# Patient Record
Sex: Male | Born: 1940 | Race: White | Hispanic: No | Smoking: Former smoker
Health system: Southern US, Community
[De-identification: ages and names within clinical notes are randomized; demographics above are authoritative.]

## PROBLEM LIST (undated history)

## (undated) DIAGNOSIS — Z87891 Personal history of nicotine dependence: Secondary | ICD-10-CM

---

## 2016-11-13 ENCOUNTER — Emergency Department (HOSPITAL_COMMUNITY): Payer: Medicare Other

## 2016-11-13 ENCOUNTER — Inpatient Hospital Stay (HOSPITAL_COMMUNITY)
Admission: EM | Admit: 2016-11-13 | Discharge: 2016-12-15 | DRG: 023 | Disposition: E | Payer: Medicare Other | Attending: Emergency Medicine | Admitting: Emergency Medicine

## 2016-11-13 DIAGNOSIS — I619 Nontraumatic intracerebral hemorrhage, unspecified: Secondary | ICD-10-CM

## 2016-11-13 DIAGNOSIS — G936 Cerebral edema: Secondary | ICD-10-CM | POA: Diagnosis present

## 2016-11-13 DIAGNOSIS — Z7982 Long term (current) use of aspirin: Secondary | ICD-10-CM | POA: Diagnosis not present

## 2016-11-13 DIAGNOSIS — J9600 Acute respiratory failure, unspecified whether with hypoxia or hypercapnia: Secondary | ICD-10-CM | POA: Diagnosis not present

## 2016-11-13 DIAGNOSIS — Z7189 Other specified counseling: Secondary | ICD-10-CM

## 2016-11-13 DIAGNOSIS — E785 Hyperlipidemia, unspecified: Secondary | ICD-10-CM | POA: Diagnosis present

## 2016-11-13 DIAGNOSIS — I161 Hypertensive emergency: Secondary | ICD-10-CM | POA: Diagnosis present

## 2016-11-13 DIAGNOSIS — I610 Nontraumatic intracerebral hemorrhage in hemisphere, subcortical: Secondary | ICD-10-CM

## 2016-11-13 DIAGNOSIS — R739 Hyperglycemia, unspecified: Secondary | ICD-10-CM | POA: Diagnosis not present

## 2016-11-13 DIAGNOSIS — J9601 Acute respiratory failure with hypoxia: Secondary | ICD-10-CM | POA: Diagnosis present

## 2016-11-13 DIAGNOSIS — N179 Acute kidney failure, unspecified: Secondary | ICD-10-CM | POA: Diagnosis present

## 2016-11-13 DIAGNOSIS — I10 Essential (primary) hypertension: Secondary | ICD-10-CM | POA: Diagnosis present

## 2016-11-13 DIAGNOSIS — Z515 Encounter for palliative care: Secondary | ICD-10-CM | POA: Diagnosis not present

## 2016-11-13 DIAGNOSIS — I615 Nontraumatic intracerebral hemorrhage, intraventricular: Secondary | ICD-10-CM | POA: Diagnosis present

## 2016-11-13 DIAGNOSIS — Z9911 Dependence on respirator [ventilator] status: Secondary | ICD-10-CM | POA: Diagnosis not present

## 2016-11-13 DIAGNOSIS — Z66 Do not resuscitate: Secondary | ICD-10-CM | POA: Diagnosis not present

## 2016-11-13 DIAGNOSIS — E8770 Fluid overload, unspecified: Secondary | ICD-10-CM | POA: Diagnosis not present

## 2016-11-13 DIAGNOSIS — I629 Nontraumatic intracranial hemorrhage, unspecified: Secondary | ICD-10-CM

## 2016-11-13 DIAGNOSIS — D72829 Elevated white blood cell count, unspecified: Secondary | ICD-10-CM

## 2016-11-13 DIAGNOSIS — J9690 Respiratory failure, unspecified, unspecified whether with hypoxia or hypercapnia: Secondary | ICD-10-CM

## 2016-11-13 DIAGNOSIS — Z87891 Personal history of nicotine dependence: Secondary | ICD-10-CM | POA: Diagnosis not present

## 2016-11-13 DIAGNOSIS — R4182 Altered mental status, unspecified: Secondary | ICD-10-CM | POA: Diagnosis present

## 2016-11-13 DIAGNOSIS — I618 Other nontraumatic intracerebral hemorrhage: Secondary | ICD-10-CM | POA: Diagnosis present

## 2016-11-13 DIAGNOSIS — G8194 Hemiplegia, unspecified affecting left nondominant side: Secondary | ICD-10-CM | POA: Diagnosis present

## 2016-11-13 DIAGNOSIS — G911 Obstructive hydrocephalus: Secondary | ICD-10-CM

## 2016-11-13 DIAGNOSIS — Z978 Presence of other specified devices: Secondary | ICD-10-CM

## 2016-11-13 HISTORY — DX: Personal history of nicotine dependence: Z87.891

## 2016-11-13 LAB — DIFFERENTIAL
BASOS PCT: 0 %
Basophils Absolute: 0 10*3/uL (ref 0.0–0.1)
EOS ABS: 0.1 10*3/uL (ref 0.0–0.7)
Eosinophils Relative: 2 %
Lymphocytes Relative: 11 %
Lymphs Abs: 1 10*3/uL (ref 0.7–4.0)
MONO ABS: 0.6 10*3/uL (ref 0.1–1.0)
MONOS PCT: 7 %
Neutro Abs: 7.2 10*3/uL (ref 1.7–7.7)
Neutrophils Relative %: 80 %

## 2016-11-13 LAB — I-STAT CHEM 8, ED
BUN: 15 mg/dL (ref 6–20)
Calcium, Ion: 1.04 mmol/L — ABNORMAL LOW (ref 1.15–1.40)
Chloride: 104 mmol/L (ref 101–111)
Creatinine, Ser: 1.1 mg/dL (ref 0.61–1.24)
GLUCOSE: 162 mg/dL — AB (ref 65–99)
HEMATOCRIT: 40 % (ref 39.0–52.0)
HEMOGLOBIN: 13.6 g/dL (ref 13.0–17.0)
POTASSIUM: 3.7 mmol/L (ref 3.5–5.1)
Sodium: 139 mmol/L (ref 135–145)
TCO2: 22 mmol/L (ref 0–100)

## 2016-11-13 LAB — I-STAT ARTERIAL BLOOD GAS, ED
Bicarbonate: 25.7 mmol/L (ref 20.0–28.0)
O2 Saturation: 100 %
PO2 ART: 459 mmHg — AB (ref 83.0–108.0)
TCO2: 27 mmol/L (ref 0–100)
pCO2 arterial: 44.3 mmHg (ref 32.0–48.0)
pH, Arterial: 7.37 (ref 7.350–7.450)

## 2016-11-13 LAB — CBC
HEMATOCRIT: 41.6 % (ref 39.0–52.0)
Hemoglobin: 14.7 g/dL (ref 13.0–17.0)
MCH: 30.7 pg (ref 26.0–34.0)
MCHC: 35.3 g/dL (ref 30.0–36.0)
MCV: 86.8 fL (ref 78.0–100.0)
Platelets: 163 10*3/uL (ref 150–400)
RBC: 4.79 MIL/uL (ref 4.22–5.81)
RDW: 13.7 % (ref 11.5–15.5)
WBC: 8.9 10*3/uL (ref 4.0–10.5)

## 2016-11-13 LAB — CBG MONITORING, ED: GLUCOSE-CAPILLARY: 156 mg/dL — AB (ref 65–99)

## 2016-11-13 LAB — COMPREHENSIVE METABOLIC PANEL
ALT: 11 U/L — ABNORMAL LOW (ref 17–63)
AST: 20 U/L (ref 15–41)
Albumin: 3.5 g/dL (ref 3.5–5.0)
Alkaline Phosphatase: 59 U/L (ref 38–126)
Anion gap: 10 (ref 5–15)
BUN: 13 mg/dL (ref 6–20)
CHLORIDE: 104 mmol/L (ref 101–111)
CO2: 22 mmol/L (ref 22–32)
Calcium: 8.7 mg/dL — ABNORMAL LOW (ref 8.9–10.3)
Creatinine, Ser: 1.21 mg/dL (ref 0.61–1.24)
GFR, EST NON AFRICAN AMERICAN: 56 mL/min — AB (ref >60)
Glucose, Bld: 156 mg/dL — ABNORMAL HIGH (ref 65–99)
POTASSIUM: 3.7 mmol/L (ref 3.5–5.1)
Sodium: 136 mmol/L (ref 135–145)
Total Bilirubin: 1 mg/dL (ref 0.3–1.2)
Total Protein: 7.1 g/dL (ref 6.5–8.1)

## 2016-11-13 LAB — I-STAT TROPONIN, ED: TROPONIN I, POC: 0.01 ng/mL (ref 0.00–0.08)

## 2016-11-13 LAB — MRSA PCR SCREENING: MRSA by PCR: NEGATIVE

## 2016-11-13 LAB — TRIGLYCERIDES: TRIGLYCERIDES: 81 mg/dL (ref <150)

## 2016-11-13 LAB — PROTIME-INR
INR: 1.07
Prothrombin Time: 13.9 seconds (ref 11.4–15.2)

## 2016-11-13 LAB — APTT: APTT: 34 s (ref 24–36)

## 2016-11-13 MED ORDER — FENTANYL CITRATE (PF) 100 MCG/2ML IJ SOLN
50.0000 ug | Freq: Once | INTRAMUSCULAR | Status: AC
  Administered 2016-11-13: 50 ug via INTRAVENOUS

## 2016-11-13 MED ORDER — ACETAMINOPHEN 160 MG/5ML PO SOLN
650.0000 mg | ORAL | Status: DC | PRN
  Administered 2016-11-13 – 2016-11-19 (×11): 650 mg
  Filled 2016-11-13 (×11): qty 20.3

## 2016-11-13 MED ORDER — ONDANSETRON HCL 4 MG/2ML IJ SOLN
4.0000 mg | Freq: Once | INTRAMUSCULAR | Status: AC
  Administered 2016-11-13: 4 mg via INTRAVENOUS
  Filled 2016-11-13: qty 2

## 2016-11-13 MED ORDER — ACETAMINOPHEN 325 MG PO TABS: 650.0000 mg | ORAL_TABLET | ORAL | Status: DC | PRN

## 2016-11-13 MED ORDER — ACETAMINOPHEN 650 MG RE SUPP: 650.0000 mg | RECTAL | Status: DC | PRN

## 2016-11-13 MED ORDER — LABETALOL HCL 5 MG/ML IV SOLN
20.0000 mg | Freq: Once | INTRAVENOUS | Status: AC
  Administered 2016-11-13: 20 mg via INTRAVENOUS
  Filled 2016-11-13: qty 4

## 2016-11-13 MED ORDER — NICARDIPINE HCL IN NACL 20-0.86 MG/200ML-% IV SOLN
3.0000 mg/h | Freq: Once | INTRAVENOUS | Status: AC
  Administered 2016-11-13: 3 mg/h via INTRAVENOUS
  Filled 2016-11-13: qty 200

## 2016-11-13 MED ORDER — CHLORHEXIDINE GLUCONATE 0.12% ORAL RINSE (MEDLINE KIT): 15.0000 mL | Freq: Two times a day (BID) | OROMUCOSAL | Status: DC

## 2016-11-13 MED ORDER — PROPOFOL 1000 MG/100ML IV EMUL
0.0000 ug/kg/min | INTRAVENOUS | Status: DC
  Administered 2016-11-13: 2 mL via INTRAVENOUS
  Administered 2016-11-13: 1 mL via INTRAVENOUS
  Administered 2016-11-13: 25 ug/kg/min via INTRAVENOUS
  Administered 2016-11-13: 5 ug/kg/min via INTRAVENOUS
  Administered 2016-11-14: 20 ug/kg/min via INTRAVENOUS
  Administered 2016-11-15 – 2016-11-16 (×3): 25 ug/kg/min via INTRAVENOUS
  Administered 2016-11-16 (×2): 35 ug/kg/min via INTRAVENOUS
  Administered 2016-11-17 (×2): 50 ug/kg/min via INTRAVENOUS
  Administered 2016-11-17: 35 ug/kg/min via INTRAVENOUS
  Administered 2016-11-17 – 2016-11-18 (×3): 40 ug/kg/min via INTRAVENOUS
  Administered 2016-11-18: 30 ug/kg/min via INTRAVENOUS
  Administered 2016-11-18 (×2): 50 ug/kg/min via INTRAVENOUS
  Administered 2016-11-19: 40 ug/kg/min via INTRAVENOUS
  Administered 2016-11-19 (×3): 50 ug/kg/min via INTRAVENOUS
  Filled 2016-11-13 (×2): qty 100
  Filled 2016-11-13: qty 200
  Filled 2016-11-13 (×19): qty 100

## 2016-11-13 MED ORDER — SUCCINYLCHOLINE CHLORIDE 20 MG/ML IJ SOLN
100.0000 mg | Freq: Once | INTRAMUSCULAR | Status: AC
Start: 2016-11-13 — End: 2016-11-13
  Administered 2016-11-13: 100 mg via INTRAVENOUS

## 2016-11-13 MED ORDER — ORAL CARE MOUTH RINSE
15.0000 mL | Freq: Four times a day (QID) | OROMUCOSAL | Status: DC
  Administered 2016-11-13: 15 mL via OROMUCOSAL

## 2016-11-13 MED ORDER — ETOMIDATE 2 MG/ML IV SOLN
20.0000 mg | Freq: Once | INTRAVENOUS | Status: AC
  Administered 2016-11-13: 20 mg via INTRAVENOUS

## 2016-11-13 MED ORDER — FENTANYL CITRATE (PF) 100 MCG/2ML IJ SOLN
50.0000 ug | INTRAMUSCULAR | Status: DC | PRN
  Administered 2016-11-13 (×2): 50 ug via INTRAVENOUS
  Filled 2016-11-13 (×2): qty 2

## 2016-11-13 MED ORDER — FAMOTIDINE IN NACL 20-0.9 MG/50ML-% IV SOLN
20.0000 mg | Freq: Two times a day (BID) | INTRAVENOUS | Status: DC
  Administered 2016-11-13 – 2016-11-15 (×4): 20 mg via INTRAVENOUS
  Filled 2016-11-13 (×4): qty 50

## 2016-11-13 MED ORDER — FENTANYL CITRATE (PF) 100 MCG/2ML IJ SOLN
INTRAMUSCULAR | Status: AC
  Administered 2016-11-13: 50 ug via INTRAVENOUS
  Filled 2016-11-13: qty 2

## 2016-11-13 MED ORDER — SODIUM CHLORIDE 0.9 % IV SOLN
INTRAVENOUS | Status: DC
  Administered 2016-11-13 – 2016-11-16 (×6): via INTRAVENOUS

## 2016-11-13 MED ORDER — FENTANYL CITRATE (PF) 100 MCG/2ML IJ SOLN
50.0000 ug | INTRAMUSCULAR | Status: DC | PRN
  Administered 2016-11-14 – 2016-11-18 (×7): 50 ug via INTRAVENOUS
  Filled 2016-11-13 (×7): qty 2

## 2016-11-13 MED ORDER — SENNOSIDES-DOCUSATE SODIUM 8.6-50 MG PO TABS
1.0000 | ORAL_TABLET | Freq: Two times a day (BID) | ORAL | Status: DC
  Administered 2016-11-13 – 2016-11-18 (×8): 1 via ORAL
  Filled 2016-11-13 (×9): qty 1

## 2016-11-13 MED ORDER — STROKE: EARLY STAGES OF RECOVERY BOOK
Freq: Once | Status: DC
  Filled 2016-11-13: qty 1

## 2016-11-13 MED ORDER — NICARDIPINE HCL IN NACL 20-0.86 MG/200ML-% IV SOLN
0.0000 mg/h | INTRAVENOUS | Status: DC
  Administered 2016-11-13: 3 mg/h via INTRAVENOUS
  Administered 2016-11-13: 5 mg/h via INTRAVENOUS
  Administered 2016-11-14: 3 mg/h via INTRAVENOUS
  Administered 2016-11-14: 5 mg/h via INTRAVENOUS
  Filled 2016-11-13 (×4): qty 200

## 2016-11-13 NOTE — ED Notes (Signed)
Critical care MD at bedside 

## 2016-11-13 NOTE — ED Notes (Signed)
Pt intubated by Dr Denton LankSteinl with 7.5 ETT tube.

## 2016-11-13 NOTE — ED Notes (Signed)
Family at bedside. 

## 2016-11-13 NOTE — ED Notes (Signed)
Neurosurgeon at bedside placed an external drainage catheter for drainage of fluid from brain. RN from 3100 came to assist with procedure.

## 2016-11-13 NOTE — Consult Note (Addendum)
CC:  Chief Complaint  Patient presents with  . Code Stroke    HPI: Jake Jacobs is a 75 y.o. male presenting to the ED via EMS today after he and his wife noted left-sided weakness this am. He was having trouble getting up from the couch and walking earlier this morning, and then was working at the computer and slumped over. He apparently was awake and talking upon his arrival to the ED, but when he returned from CT he was somnolent, unresponsive, and required intubation for airway protection. His wife states he does not have any known medical problems because he has not seen a doctor in more than 10 years.  PMH: No past medical history on file.  PSH: No past surgical history on file.  SH: Social History  Substance Use Topics  . Smoking status: Not on file  . Smokeless tobacco: Not on file  . Alcohol use Not on file    MEDS: Prior to Admission medications   Not on File    ALLERGY: Allergies not on file  ROS: ROS  NEUROLOGIC EXAM: Intubated, on low-dose propofol: Pupils 2mm, reactive Breathes over vent (+) cough/gag No eye opening to pain Minimal withdrawal RUE to pain. 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 The 3rd and 4th ventricles are casted as is the right lateral ventricle. There is evidence of ventriculmoegaly with dilated lateral ventircles and temporal horns.  IMPRESSION: - 75 y.o. 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  PLAN: - Will place EVD - Admit to ICU under neurology - SBP control  I have reviewed the situation with the patient's family. Indications, risks, benefits, and alternatives to the drain were discussed. All questions were answered and consent was obtained.

## 2016-11-13 NOTE — ED Notes (Signed)
Checked patient blood sugar it was 156 notified RN keshia of blood sugar

## 2016-11-13 NOTE — Progress Notes (Signed)
Responded to page and initially prayed with wife, stepdaughter and brother-in-law after doctor's first consultation w/ family, providing emotional/spiritual support and prayer. Wife said she and pt each had a wonderful first marriage and then have had a wonderful second to each other for 10 yrs and she was not ready to let him go. Stepdaughter had notified their pastor, who had stopped the church service so all could pray for pt. Since it would take pastor a while to come, gave his name to reception w/ instructions to bring him to wherever family was when he arrived. After pt moved to D31, family alternated visiting two at a time from ED consultation rm all afternoon as more arrived (including other stepdaughter and one of their sons).  Visited further with family members throughout afternoon, esp. before and after pt's procedure in D31 to provide drainage. (By then, their pastor had come, sat with them a while in ED consultation rm, and left.) Pt will likely be moved to 75M later. Upon last visit with wife in rm, she seemed stronger -- had been at first quite distraught and stepdaughter said wife was again shocked at sight of tube in pt's head (had imagined it would be in his neck, but has gotten used to it). Nurse said she'd talked with both stepdaughters during afternoon to better convey consequences of pt's (not recoverable) condition: it was unrealistic for their mom to think he'd be able just to get up and walk out. Nurse believes daughters now have a better grasp of pt's medical realities than their mom does. Chaplain available for f/u.    10/24/2016 1700  Clinical Encounter Type  Visited With Family;Health care provider  Visit Type Initial;Follow-up;Psychological support;Spiritual support;Social support;ED;Trauma  Referral From Nurse  Spiritual Encounters  Spiritual Needs Prayer;Emotional;Grief support;Other (Comment) (pt's poor prognosis)  Stress Factors  Family Stress Factors Family  relationships;Health changes;Loss of control   Ephraim Hamburgerynthia A Sabrina Arriaga, [address]

## 2016-11-13 NOTE — Op Note (Signed)
PREOP DX: Hydrocephalus  POSTOP DX: Same  PROCEDURE: Left frontal ventriculostomy   SURGEON: Dr. Lisbeth RenshawNeelesh Maragret Vanacker, MD  ANESTHESIA: IV Sedation (propofol and fentanyl) with Local  EBL: Minimal  SPECIMENS: None  COMPLICATIONS: None  CONDITION: Hemodynamically stable  INDICATIONS: Mr. Jake Jacobs is a 75 y.o. 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 EVD was therefore indicated. Risks, benefits, and alternatives to the drain were discussed with the family and consent was obtained.  PROCEDURE IN DETAIL: After consent was obtained from the patient's family, skin of the left frontal scalp was clipped, prepped and draped in the usual sterile fashion.  Scalp was then infiltrated with local anesthetic with epinephrine.  Skin incision was made sharply, and twist drill burr hole was made.  The dura was then incised, and the ventricular catheter was passed in single attempt into the left lateral ventricle.  Good CSF flow was obtained.  The catheter was then tunneled subcutaneously and connected to a drainage system and the skin incision closed.  The drain was then secured in place. [facility] open at 15mmHg.  FINDINGS: 1. Opening pressure ~20cmH2O 2. Slightly blood tinged CSF

## 2016-11-13 NOTE — Consult Note (Addendum)
NEURO HOSPITALIST CONSULT NOTE   Requestig physician: Dr. Denton LankSteinl  Reason for Consult: ICH  History obtained from:  Patient's Family and Chart     HPI:                                                                                                                                          Jake Jacobs is an 75 y.o. male who presented with acute onset of altered mental status and left hemiplegia. He was in his USOH at home working at the computer. He suddenly slumped over while sitting at computer about 10:45 AM. Witnessed by wife, who called daughter, who then called EMS. Vomited at home. En route to ED, EMS called a code stroke for new left sided weakness. Level of consciousness declined rapidly between the initial ED arrival and exam immediately following STAT CT head. Not able to protect airway. Sent to trauma bay for intubation. Will be admitted to MICU and Neurosurgery has evaluated the CT, considering placement of ventricular drain for acute hydrocephalus precipitated by intraventricular blood.   Per daughter he has been sick the last few days with a severe cough, but no fever.   Per daughter, he has no history of stroke or ICH.   No past medical history on file.  No past surgical history on file.  No family history on file.  Social History:  has no tobacco, alcohol, and drug history on file.  Allergies not on file  MEDICATIONS:                                                                                                                     No home medications listed in the chart at the time of this acute assessment. Will review when available.   ROS:  Unable to obtain due to obtundation.   There were no vitals taken for this visit.  General Examination:                                                                                                       HEENT-  Normocephalic/atraumatic.  Lungs- Sonorous breathing with likely inability to protect airway. Extremities- No edema.   Neurological Examination Mental Status: Somnolent. Disoriented. Limited speech output is slurred and incomprehensible.  Cranial Nerves: II: Does not blink to threat but will attend briefly on visual stimulus. Pupils small and reactive.  III,IV, VI: Able to gaze to left and right with difficulty.  V,VII: Left facial droop. VIII: unable to formally assess IX,X: hoarseness noted (vomited) XI: Decreased movement on left XII: Did not follow command.  Motor/Sensory: RUE and RLE: Withdraw briskly to noxious with purposeful movements.  LUE: Flaccid with no movement.  LLE: Weak withdrawal to noxious.  Deep Tendon Reflexes: Hyperreflexic x 4.  Plantars: Right: upgoing   Left: equivocal Cerebellar/Gait: Unable to test.  Lab Results: Basic Metabolic Panel:  Recent Labs Lab 11/01/2016 1207  NA 139  K 3.7  CL 104  GLUCOSE 162*  BUN 15  CREATININE 1.10    Liver Function Tests: No results for input(s): AST, ALT, ALKPHOS, BILITOT, PROT, ALBUMIN in the last 168 hours. No results for input(s): LIPASE, AMYLASE in the last 168 hours. No results for input(s): AMMONIA in the last 168 hours.  CBC:  Recent Labs Lab 11/11/2016 1207  HGB 13.6  HCT 40.0    Cardiac Enzymes: No results for input(s): CKTOTAL, CKMB, CKMBINDEX, TROPONINI in the last 168 hours.  Lipid Panel: No results for input(s): CHOL, TRIG, HDL, CHOLHDL, VLDL, LDLCALC in the last 168 hours.  CBG:  Recent Labs Lab 10/21/2016 1156  GLUCAP 156*    Microbiology: No results found for this or any previous visit.  Coagulation Studies: No results for input(s): LABPROT, INR in the last 72 hours.  Imaging: No results found.  Assessment: 75 year old with large acute right thalamic hemorrhage with extension into the 3rd, lateral and 4th ventricles, as well as right to  left midline shift and acute hydrocephalus. 1. Based upon the location, the hemorrhage appears most likely to be due to hypertension.  2. Hematoma is likely expanding given worsened exam findings during the course of initial Neurological evaluation, prior to intubation.  3. At home had severe cough. MICU team will need to evaluate further.  4. Given size/extent of hemorrhage in conjunction with patient's age, there is a high likelihood of a poor prognosis.  5. ICH score = 3  Recommendations: 1. MICU team is admitting. Patient is intubated. Neurology will follow.  2. BP management. SBP goal of < 160.  3. No antiplatelet medications or anticoagulants. DVT prophylaxis with SCDs.  4. Repeat CT in 24 hours, or earlier if condition worsens.  5. Neurosurgery has evaluated the CT and is considering placement of ventricular drain for acute hydrocephalus precipitated by intraventricular blood.  6. Obtain CXR given possible aspiration during episode of vomiting,  as well as history of severe cough at home.  7. When patient is stable, obtain MRI brain with and without contrast to assess for possible underlying lesion.  8. Frequent neuro checks.   Electronically signed: Dr. Caryl PinaEric Haroon Shatto 04-Jun-2016, 12:17 PM

## 2016-11-13 NOTE — H&P (Signed)
PULMONARY / CRITICAL CARE MEDICINE   Name: Jake Jacobs MRN: [id number] DOB: 01/16/1941    ADMISSION DATE:  12-08-2016 CONSULTATION DATE:  12/312  REFERRING MD:  Denton Lank   CHIEF COMPLAINT:  ICH  HISTORY OF PRESENT ILLNESS:   75 year old male who presented acutely to the ER at [facility] on 12/31 w/ acute onset of altered mental status and left sided hemiplegia. Apparently was in USOH until about 1045 when he suddenly slumped over while working at his computer. This was witnessed. EMS called. He vomited prior to EMS arrival. His LOC declined in route to hospital CODE STROKE initiated on admit. He was intubated for airway protection. STAT CT head obtained. This showed: large right ICH w/ associated IVH and evolving hydrocephalus. Neuro and Neuro-surgical services were consulted. PCCM asked to admit.   PAST MEDICAL HISTORY :  He  has no past medical history on file. Did not see doctors  PAST SURGICAL HISTORY: He  has no past surgical history on file.  Allergies not on file  No current facility-administered medications on file prior to encounter.    No current outpatient prescriptions on file prior to encounter.    FAMILY HISTORY:  His has no family status information on file.    SOCIAL HISTORY: He  lives w/ wife. Non smoker   REVIEW OF SYSTEMS:   Unable to assess  SUBJECTIVE:  Heavily sedated  VITAL SIGNS: BP 159/81   Pulse 74   Temp 97.7 F (36.5 C)   Resp 20   Ht 5\' 11"  (1.803 m)   Wt 165 lb (74.8 kg)   SpO2 100%   BMI 23.01 kg/m   HEMODYNAMICS:    VENTILATOR SETTINGS: Vent Mode: PRVC FiO2 (%):  [40 %-100 %] 40 % Set Rate:  [16 bmp] 16 bmp Vt Set:  [550 mL-600 mL] 600 mL PEEP:  [5 cmH20] 5 cmH20 Plateau Pressure:  [14 cmH20] 14 cmH20  INTAKE / OUTPUT: No intake/output data recorded.  PHYSICAL EXAMINATION: General:  75 year old white male. Sedated on vent  Neuro:  Sedated. Not following commands. Was weak on left side but oriented prior to  intubation HEENT:  NCAT. Orally intubated. ICV in place  Cardiovascular:  RRR w/out MRG Lungs:  Clear to auscultation no accessory use equal chest rise Abdomen:  Soft, not tender + bowel sounds  Musculoskeletal:  Equal bulk  Skin:  Warm and dry  LABS:  BMET  Recent Labs Lab 08-Dec-2016 1158 2016/12/08 1207  NA 136 139  K 3.7 3.7  CL 104 104  CO2 22  --   BUN 13 15  CREATININE 1.21 1.10  GLUCOSE 156* 162*    Electrolytes  Recent Labs Lab 2016/12/08 1158  CALCIUM 8.7*    CBC  Recent Labs Lab 08-Dec-2016 1158 08-Dec-2016 1207  WBC 8.9  --   HGB 14.7 13.6  HCT 41.6 40.0  PLT 163  --     Coag's  Recent Labs Lab 12-08-2016 1158  APTT 34  INR 1.07    Sepsis Markers No results for input(s): LATICACIDVEN, PROCALCITON, O2SATVEN in the last 168 hours.  ABG  Recent Labs Lab 08-Dec-2016 1355  PHART 7.370  PCO2ART 44.3  PO2ART 459.0*    Liver Enzymes  Recent Labs Lab 12/08/16 1158  AST 20  ALT 11*  ALKPHOS 59  BILITOT 1.0  ALBUMIN 3.5    Cardiac Enzymes No results for input(s): TROPONINI, PROBNP in the last 168 hours.  Glucose  Recent Labs Lab 08-Dec-2016 1156  GLUCAP 156*    Imaging Dg Chest Portable 1 View  Result Date: 2016-04-11 CLINICAL DATA:  Endotracheal tube placement. EXAM: PORTABLE CHEST 1 VIEW COMPARISON:  None. FINDINGS: The heart size and mediastinal contours are within normal limits. Both lungs are clear. No pneumothorax or pleural effusion is noted. Endotracheal tube is seen projected over tracheal air shadow with distal tip 7 cm above the carina. Distal tip of nasogastric tube is seen at gastroesophageal junction. The visualized skeletal structures are unremarkable. IMPRESSION: Endotracheal tube in grossly good position. Distal tip of nasogastric tube seen in expected position of gastroesophageal junction. No acute cardiopulmonary abnormality seen. Electronically Signed   By: Lupita RaiderJames  Green Jr, M.D.   On: 2016-04-11 13:33   Ct Head Code  Stroke W/o Cm  Addendum Date: 2016-04-11   ADDENDUM REPORT: 2016-04-11 12:56 ADDENDUM: Study discussed by telephone with Dr. Otelia LimesLindzen on 2016-04-11 at 12:56 . At 1234 hours. Electronically Signed   By: Odessa FlemingH  Hall M.D.   On: 2016-04-11 12:56   Result Date: 2016-04-11 CLINICAL DATA:  Code stroke. 75 year old male found slumped over, last seen normal at 10 45 hours today. Initial encounter. EXAM: CT HEAD WITHOUT CONTRAST TECHNIQUE: Contiguous axial images were obtained from the base of the skull through the vertex without intravenous contrast. COMPARISON:  None. FINDINGS: Study is degraded by motion artifact. Brain: Hyperdense hemorrhage centered in the right deep gray matter nuclei with secondary extension into the ventricular system, probably initially the right lateral ventricle. The lobulated intra-axial hemorrhage encompasses 40 mm diameter foreign estimated blood volume of 32 mL. Regional mass effect including about 8 mm of leftward midline shift. Moderate volume of intraventricular hemorrhage including in the third and fourth ventricles. There is acute ventriculomegaly suspected with mild transependymal edema. Superimposed nonspecific bilateral periventricular white matter hypodensity also suspected. Basilar cisterns are patent. There is a chronic appearing CSF density collection posterior to the right cerebellum measuring 10 mm in thickness. Vascular: No suspicious intracranial vascular hyperdensity. Skull: Motion artifact obscures detail at the skullbase, but no skull fracture is identified. Sinuses/Orbits: Clear. Other: Mildly disc conjugate gaze, otherwise negative orbits soft tissues. Visualized scalp soft tissues are within normal limits. ASPECTS [facility]([place] Stroke Program Early CT Score) Total score (0-10 with 10 being normal): Not applicable, acute intracranial hemorrhage. IMPRESSION: 1. Acute right deep gray matter hemorrhage with estimated parenchymal blood volume of 32 mL. Regional mass effect with  leftward midline shift of 8 mm. 2. Extension of hemorrhage into the ventricles with moderate volume IVH and acute ventriculomegaly suspected. 3. ASPECTS is not applicable, acute intracranial hemorrhage. 4. Preliminary report relayed via text pager to Dr. Agnes LawrenceE. Lindzen on 2016-04-11 at 1219 hours, I am awaiting a return call at the time of this report. Electronically Signed: By: Odessa FlemingH  Hall M.D. On: 2016-04-11 12:22     STUDIES:  CT head 12/31: 1. Acute right deep gray matter hemorrhage w Regional mass effect with leftward midline shift of 8 mm.2. Extension of hemorrhage into the ventricles with moderate volume IVH and acute ventriculomegaly suspected.   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 Now s/p IVC per neuro-surg. Will admit to ICU, keep SBP <160 & provide supportive care. IVC management and further Neuro-diagnostics per Neuro and Neuro-surgical services.    ASSESSMENT / PLAN:  PULMONARY A: Ventilatory dependence in setting of inability to protect airway  P:   Full vent support F/u am cxr  and abg PAD protocol   CARDIOVASCULAR A:  HTN  P:  Tele SBP goal < 160 Cycle CEs  RENAL A:   No acute  P:   Trend bmp Gentle IVFs Strict I&O  GASTROINTESTINAL A:   Nausea/vomiting (d/t increased ICP?) P:   NPO NGT to LIWS Reassess 1/1 for tubefeeds   HEMATOLOGIC A:   No acute  P:  scds in setting of ICH Trend CBC Transfuse per ICU protocol   INFECTIOUS A:   No acute  P:   Trend CBC & fever curve  ENDOCRINE A:   Mild hyperglycemia  P:   ssi if persistently > 180   NEUROLOGIC A:   Right ICH w/ associated IVH and evolving hydrocephalus  P:   RASS goal: -2 SBP < 160 IVC per N-surg Eventually MRI brain   FAMILY  - Updates:   - Inter-disciplinary family meet or Palliative Care meeting due by:  1/6   My ccm time 35 minutes Simonne MartinetPeter E  Babcock ACNP-BC [facility] Pager # [phone] OR # [phone] if no answer March 11, 2016, 3:42 PM  Attending Note:  I have examined patient, reviewed labs, studies and notes. I have discussed the case with Kreg ShropshireP Babcock, and I agree with the data and plans as amended above. 75 yo man, was well until acute onset of AMS and L sided weakness. Quickly deteriorated and required intubation for airway protection in ED. Head Ct revealed large R ICH with IVH and obstructive hydrocephalus. He underwent urgent ventric drain in the ED by NSGY. On my eval he remains heavily sedated. He is ventilated, has a clear lung exam. ventric site is CDI - no drainage seen in reservoir yet. We will review vent orders, keep BP at target 140-160, manage his sedation. Serial CT's per NSGY plans and timing. We will assess for wake up and possible SBT as soon as stable from neuro standpoint.  Independent critical care time is 45 minutes.   Levy Pupaobert Demara Lover, MD, PhD March 11, 2016, 6:18 PM [place] [facility] [phone] or if no answer [phone]

## 2016-11-13 NOTE — ED Triage Notes (Signed)
Received pt from home with c/o LSN at 1045. Wife reports that pt has been sick with cold/flu like symptoms for couple days. Pt told wife that he thinks he fell out of bed last night but was not sure. Wife reports that she found pt at computer desk slumped over and unable to get up.

## 2016-11-13 NOTE — ED Provider Notes (Addendum)
[facility]-EMERGENCY DEPT Provider Note   CSN: [id number] Arrival date & time: 11/10/2016  1154     History   Chief Complaint Chief Complaint  Patient presents with  . Code Stroke    HPI Jake Jacobs is a 75 y.o. male.  Patient arrives via EMS - they were called to home as a sick call w pt vomiting.  Patient arrives to ED, not verbally responsive to questions, with emesis about mouth.  EMS did note left sided weakness, and pt was taken emergently to CT as a code stroke. Pt unresponsive verbally - level 5 caveat.    The history is provided by the patient, the EMS personnel and a relative. The history is limited by the condition of the patient.    No past medical history on file.  There are no active problems to display for this patient.   No past surgical history on file.     Home Medications    Prior to Admission medications   Not on File    Family History No family history on file.  Social History Social History  Substance Use Topics  . Smoking status: Not on file  . Smokeless tobacco: Not on file  . Alcohol use Not on file     Allergies   Patient has no allergy information on record.   Review of Systems Review of Systems  Unable to perform ROS: Patient unresponsive  level 5 caveat.    Physical Exam Updated Vital Signs There were no vitals taken for this visit.  Physical Exam  Constitutional: He appears well-developed and well-nourished. He appears distressed.  HENT:  Emesis about mouth  Eyes: Conjunctivae are normal. Pupils are equal, round, and reactive to light.  Neck: Neck supple. No tracheal deviation present.  Cardiovascular: Normal rate, regular rhythm, normal heart sounds and intact distal pulses.   Pulmonary/Chest: Effort normal and breath sounds normal. No accessory muscle usage. No respiratory distress.  Abdominal: Soft. He exhibits no distension. There is no tenderness.  Musculoskeletal: He exhibits no edema.  Neurological:    Patient awake. Moving right side purposefully w good strength. No movement on left.   Skin: Skin is warm and dry.  Psychiatric: He has a normal mood and affect.  Nursing note and vitals reviewed.    ED Treatments / Results  Labs (all labs ordered are listed, but only abnormal results are displayed) Results for orders placed or performed during the hospital encounter of 10/20/2016  CBC  Result Value Ref Range   WBC 8.9 4.0 - 10.5 K/uL   RBC 4.79 4.22 - 5.81 MIL/uL   Hemoglobin 14.7 13.0 - 17.0 g/dL   HCT 08.6 57.8 - 46.9 %   MCV 86.8 78.0 - 100.0 fL   MCH 30.7 26.0 - 34.0 pg   MCHC 35.3 30.0 - 36.0 g/dL   RDW 62.9 52.8 - 41.3 %   Platelets 163 150 - 400 K/uL  Differential  Result Value Ref Range   Neutrophils Relative % 80 %   Neutro Abs 7.2 1.7 - 7.7 K/uL   Lymphocytes Relative 11 %   Lymphs Abs 1.0 0.7 - 4.0 K/uL   Monocytes Relative 7 %   Monocytes Absolute 0.6 0.1 - 1.0 K/uL   Eosinophils Relative 2 %   Eosinophils Absolute 0.1 0.0 - 0.7 K/uL   Basophils Relative 0 %   Basophils Absolute 0.0 0.0 - 0.1 K/uL  CBG monitoring, ED  Result Value Ref Range   Glucose-Capillary 156 (H) 65 -  99 mg/dL  I-stat troponin, ED  Result Value Ref Range   Troponin i, poc 0.01 0.00 - 0.08 ng/mL   Comment 3          I-Stat Chem 8, ED  Result Value Ref Range   Sodium 139 135 - 145 mmol/L   Potassium 3.7 3.5 - 5.1 mmol/L   Chloride 104 101 - 111 mmol/L   BUN 15 6 - 20 mg/dL   Creatinine, Ser 1.61 0.61 - 1.24 mg/dL   Glucose, Bld 096 (H) 65 - 99 mg/dL   Calcium, Ion 0.45 (L) 1.15 - 1.40 mmol/L   TCO2 22 0 - 100 mmol/L   Hemoglobin 13.6 13.0 - 17.0 g/dL   HCT 40.9 81.1 - 91.4 %   Ct Head Code Stroke W/o Cm  Result Date: 11/09/2016 CLINICAL DATA:  Code stroke. 75 year old male found slumped over, last seen normal at 10 45 hours today. Initial encounter. EXAM: CT HEAD WITHOUT CONTRAST TECHNIQUE: Contiguous axial images were obtained from the base of the skull through the vertex  without intravenous contrast. COMPARISON:  None. FINDINGS: Study is degraded by motion artifact. Brain: Hyperdense hemorrhage centered in the right deep gray matter nuclei with secondary extension into the ventricular system, probably initially the right lateral ventricle. The lobulated intra-axial hemorrhage encompasses 40 mm diameter foreign estimated blood volume of 32 mL. Regional mass effect including about 8 mm of leftward midline shift. Moderate volume of intraventricular hemorrhage including in the third and fourth ventricles. There is acute ventriculomegaly suspected with mild transependymal edema. Superimposed nonspecific bilateral periventricular white matter hypodensity also suspected. Basilar cisterns are patent. There is a chronic appearing CSF density collection posterior to the right cerebellum measuring 10 mm in thickness. Vascular: No suspicious intracranial vascular hyperdensity. Skull: Motion artifact obscures detail at the skullbase, but no skull fracture is identified. Sinuses/Orbits: Clear. Other: Mildly disc conjugate gaze, otherwise negative orbits soft tissues. Visualized scalp soft tissues are within normal limits. ASPECTS [facility] Stroke Program Early CT Score) Total score (0-10 with 10 being normal): Not applicable, acute intracranial hemorrhage. IMPRESSION: 1. Acute right deep gray matter hemorrhage with estimated parenchymal blood volume of 32 mL. Regional mass effect with leftward midline shift of 8 mm. 2. Extension of hemorrhage into the ventricles with moderate volume IVH and acute ventriculomegaly suspected. 3. ASPECTS is not applicable, acute intracranial hemorrhage. 4. Preliminary report relayed via text pager to Dr. Agnes Lawrence on 10/20/2016 at 1219 hours, I am awaiting a return call at the time of this report. Electronically Signed   By: Odessa Fleming M.D.   On: 10/26/2016 12:22    EKG  EKG Interpretation  Date/Time:  Sunday November 13 2016 12:22:31 EST Ventricular Rate:   76 PR Interval:    QRS Duration: 155 QT Interval:  447 QTC Calculation: 503 R Axis:   -102 Text Interpretation:  Sinus rhythm Multiple premature complexes, vent & supraven RBBB and LAFB Confirmed by Denton Lank  MD, Caryn Bee ([id number]) on 11/05/2016 12:31:06 PM       Radiology Ct Head Code Stroke W/o Cm  Result Date: 11/08/2016 CLINICAL DATA:  Code stroke. 75 year old male found slumped over, last seen normal at 10 45 hours today. Initial encounter. EXAM: CT HEAD WITHOUT CONTRAST TECHNIQUE: Contiguous axial images were obtained from the base of the skull through the vertex without intravenous contrast. COMPARISON:  None. FINDINGS: Study is degraded by motion artifact. Brain: Hyperdense hemorrhage centered in the right deep gray matter nuclei with secondary extension into the ventricular system,  probably initially the right lateral ventricle. The lobulated intra-axial hemorrhage encompasses 40 mm diameter foreign estimated blood volume of 32 mL. Regional mass effect including about 8 mm of leftward midline shift. Moderate volume of intraventricular hemorrhage including in the third and fourth ventricles. There is acute ventriculomegaly suspected with mild transependymal edema. Superimposed nonspecific bilateral periventricular white matter hypodensity also suspected. Basilar cisterns are patent. There is a chronic appearing CSF density collection posterior to the right cerebellum measuring 10 mm in thickness. Vascular: No suspicious intracranial vascular hyperdensity. Skull: Motion artifact obscures detail at the skullbase, but no skull fracture is identified. Sinuses/Orbits: Clear. Other: Mildly disc conjugate gaze, otherwise negative orbits soft tissues. Visualized scalp soft tissues are within normal limits. ASPECTS [facility] Stroke Program Early CT Score) Total score (0-10 with 10 being normal): Not applicable, acute intracranial hemorrhage. IMPRESSION: 1. Acute right deep gray matter hemorrhage with  estimated parenchymal blood volume of 32 mL. Regional mass effect with leftward midline shift of 8 mm. 2. Extension of hemorrhage into the ventricles with moderate volume IVH and acute ventriculomegaly suspected. 3. ASPECTS is not applicable, acute intracranial hemorrhage. 4. Preliminary report relayed via text pager to Dr. Agnes LawrenceE. Lindzen on 10/28/2016 at 1219 hours, I am awaiting a return call at the time of this report. Electronically Signed   By: Odessa FlemingH  Hall M.D.   On: 10/24/2016 12:22    Procedures Procedure Name: Intubation Date/Time: 10/19/2016 12:45 PM Performed by: Cathren LaineSTEINL, Sherlie Boyum Pre-anesthesia Checklist: Patient identified, Emergency Drugs available, Timeout performed, Suction available and Patient being monitored Oxygen Delivery Method: Non-rebreather mask Preoxygenation: Pre-oxygenation with 100% oxygen Intubation Type: Rapid sequence Ventilation: Mask ventilation without difficulty Laryngoscope Size: Glidescope Tube size: 7.5 mm Number of attempts: 1 Placement Confirmation: ETT inserted through vocal cords under direct vision,  Breath sounds checked- equal and bilateral and CO2 detector Secured at: 23 cm Tube secured with: ETT holder      (including critical care time)  Medications Ordered in ED Medications - No data to display   Initial Impression / Assessment and Plan / ED Course  I have reviewed the triage vital signs and the nursing notes.  Pertinent labs & imaging results that were available during my care of the patient were reviewed by me and considered in my medical decision making (see chart for details).  Clinical Course    Iv ns. Continuous pulse ox and monitor. o2 [place]. Stat ct.  Ct with hemorrhage.  Family notified.   1225 NS consulted and neurology w pt.  Reviewed nursing notes and prior charts for additional history.   Patient vomiting, altered ms, unable to protect airway.  Was emergently intubated without complication. o2 sats remained  100% during  intubation.    bil bs. Stat pcxr.  Dr Conchita ParisNundkumar indicates will follow w neurology and will plan to place ventric when in unit.   Recheck pt, sedated, intubated.   CRITICAL CARE  RE acute hemorrhagic cva/thalamic hemorrhage, intraventricular hemorrhage.   Endotracheal intubation/mech vent.  Performed by: Suzi RootsSTEINL,Lawerence Dery E Total critical care time: 40 minutes Critical care time was exclusive of separately billable procedures and treating other patients. Critical care was necessary to treat or prevent imminent or life-threatening deterioration. Critical care was time spent personally by me on the following activities: development of treatment plan with patient and/or surrogate as well as nursing, discussions with consultants, evaluation of patient's response to treatment, examination of patient, obtaining history from patient or surrogate, ordering and performing treatments and interventions, ordering and review of laboratory  studies, ordering and review of radiographic studies, pulse oximetry and re-evaluation of patient's condition.   Post intubation, neurology request admit to micu/pccm.  pccm consulted. Discussed with e link PCCM doc, they will admit.     Final Clinical Impressions(s) / ED Diagnoses   Final diagnoses:  None    New Prescriptions New Prescriptions   No medications on file            Cathren LaineKevin Lissette Schenk, MD 11/03/2016 1535

## 2016-11-14 ENCOUNTER — Inpatient Hospital Stay (HOSPITAL_COMMUNITY): Payer: Medicare Other

## 2016-11-14 ENCOUNTER — Encounter (HOSPITAL_COMMUNITY): Payer: Self-pay

## 2016-11-14 DIAGNOSIS — E785 Hyperlipidemia, unspecified: Secondary | ICD-10-CM | POA: Diagnosis not present

## 2016-11-14 DIAGNOSIS — I610 Nontraumatic intracerebral hemorrhage in hemisphere, subcortical: Secondary | ICD-10-CM | POA: Diagnosis not present

## 2016-11-14 DIAGNOSIS — I161 Hypertensive emergency: Secondary | ICD-10-CM | POA: Diagnosis not present

## 2016-11-14 DIAGNOSIS — G911 Obstructive hydrocephalus: Secondary | ICD-10-CM | POA: Diagnosis not present

## 2016-11-14 DIAGNOSIS — J9601 Acute respiratory failure with hypoxia: Secondary | ICD-10-CM | POA: Diagnosis not present

## 2016-11-14 DIAGNOSIS — J9600 Acute respiratory failure, unspecified whether with hypoxia or hypercapnia: Secondary | ICD-10-CM | POA: Diagnosis not present

## 2016-11-14 DIAGNOSIS — I615 Nontraumatic intracerebral hemorrhage, intraventricular: Secondary | ICD-10-CM | POA: Diagnosis not present

## 2016-11-14 LAB — BLOOD GAS, ARTERIAL
ACID-BASE EXCESS: 1.2 mmol/L (ref 0.0–2.0)
BICARBONATE: 25.7 mmol/L (ref 20.0–28.0)
Drawn by: 44956
FIO2: 40
LHR: 16 {breaths}/min
O2 SAT: 98.6 %
PEEP/CPAP: 5 cmH2O
PH ART: 7.385 (ref 7.350–7.450)
Patient temperature: 98.6
VT: 600 mL
pCO2 arterial: 43.9 mmHg (ref 32.0–48.0)
pO2, Arterial: 118 mmHg — ABNORMAL HIGH (ref 83.0–108.0)

## 2016-11-14 LAB — BASIC METABOLIC PANEL
ANION GAP: 9 (ref 5–15)
BUN: 14 mg/dL (ref 6–20)
CALCIUM: 8.2 mg/dL — AB (ref 8.9–10.3)
CO2: 23 mmol/L (ref 22–32)
Chloride: 106 mmol/L (ref 101–111)
Creatinine, Ser: 1.17 mg/dL (ref 0.61–1.24)
GFR, EST NON AFRICAN AMERICAN: 58 mL/min — AB (ref >60)
GLUCOSE: 103 mg/dL — AB (ref 65–99)
POTASSIUM: 3.6 mmol/L (ref 3.5–5.1)
Sodium: 138 mmol/L (ref 135–145)

## 2016-11-14 LAB — GLUCOSE, CAPILLARY
GLUCOSE-CAPILLARY: 218 mg/dL — AB (ref 65–99)
GLUCOSE-CAPILLARY: 219 mg/dL — AB (ref 65–99)
Glucose-Capillary: 97 mg/dL (ref 65–99)

## 2016-11-14 LAB — LIPID PANEL
Cholesterol: 166 mg/dL (ref 0–200)
HDL: 45 mg/dL (ref >40)
LDL CALC: 106 mg/dL — AB (ref 0–99)
Total CHOL/HDL Ratio: 3.7 RATIO
Triglycerides: 77 mg/dL (ref <150)
VLDL: 15 mg/dL (ref 0–40)

## 2016-11-14 LAB — PHOSPHORUS
PHOSPHORUS: 2.1 mg/dL — AB (ref 2.5–4.6)
Phosphorus: 3.6 mg/dL (ref 2.5–4.6)
Phosphorus: 3.1 mg/dL (ref 2.5–4.6)

## 2016-11-14 LAB — VITAMIN B12: Vitamin B-12: 332 pg/mL (ref 180–914)

## 2016-11-14 LAB — RAPID URINE DRUG SCREEN, HOSP PERFORMED
AMPHETAMINES: NOT DETECTED
BARBITURATES: NOT DETECTED
BENZODIAZEPINES: NOT DETECTED
COCAINE: NOT DETECTED
OPIATES: NOT DETECTED
TETRAHYDROCANNABINOL: NOT DETECTED

## 2016-11-14 LAB — MAGNESIUM
MAGNESIUM: 2.1 mg/dL (ref 1.7–2.4)
Magnesium: 2.2 mg/dL (ref 1.7–2.4)
Magnesium: 2.1 mg/dL (ref 1.7–2.4)

## 2016-11-14 LAB — CBC
HEMATOCRIT: 35.9 % — AB (ref 39.0–52.0)
Hemoglobin: 12.2 g/dL — ABNORMAL LOW (ref 13.0–17.0)
MCH: 29.9 pg (ref 26.0–34.0)
MCHC: 34 g/dL (ref 30.0–36.0)
MCV: 88 fL (ref 78.0–100.0)
PLATELETS: 163 10*3/uL (ref 150–400)
RBC: 4.08 MIL/uL — AB (ref 4.22–5.81)
RDW: 14.1 % (ref 11.5–15.5)
WBC: 9.8 10*3/uL (ref 4.0–10.5)

## 2016-11-14 LAB — TSH: TSH: 0.857 u[IU]/mL (ref 0.350–4.500)

## 2016-11-14 MED ORDER — INSULIN ASPART 100 UNIT/ML ~~LOC~~ SOLN: 0.0000 [IU] | SUBCUTANEOUS | Status: DC

## 2016-11-14 MED ORDER — VITAL HIGH PROTEIN PO LIQD
1000.0000 mL | ORAL | Status: DC
  Administered 2016-11-14 – 2016-11-15 (×2): 1000 mL

## 2016-11-14 MED ORDER — INSULIN ASPART 100 UNIT/ML ~~LOC~~ SOLN
0.0000 [IU] | SUBCUTANEOUS | Status: DC
Start: 2016-11-14 — End: 2016-11-14

## 2016-11-14 MED ORDER — AMLODIPINE BESYLATE 10 MG PO TABS
10.0000 mg | ORAL_TABLET | Freq: Every day | ORAL | Status: DC
  Administered 2016-11-14 – 2016-11-19 (×6): 10 mg via ORAL
  Filled 2016-11-14 (×5): qty 1
  Filled 2016-11-14: qty 2

## 2016-11-14 MED ORDER — LISINOPRIL 20 MG PO TABS
20.0000 mg | ORAL_TABLET | Freq: Two times a day (BID) | ORAL | Status: DC
  Administered 2016-11-14 – 2016-11-15 (×2): 20 mg via ORAL
  Filled 2016-11-14 (×2): qty 1

## 2016-11-14 MED ORDER — LABETALOL HCL 5 MG/ML IV SOLN
10.0000 mg | INTRAVENOUS | Status: DC | PRN
  Administered 2016-11-14: 40 mg via INTRAVENOUS
  Administered 2016-11-14 (×2): 20 mg via INTRAVENOUS
  Administered 2016-11-15 (×2): 40 mg via INTRAVENOUS
  Administered 2016-11-18: 20 mg via INTRAVENOUS
  Filled 2016-11-14: qty 8
  Filled 2016-11-14: qty 4
  Filled 2016-11-14: qty 8
  Filled 2016-11-14: qty 4
  Filled 2016-11-14: qty 8
  Filled 2016-11-14 (×2): qty 4

## 2016-11-14 MED ORDER — CHLORHEXIDINE GLUCONATE 0.12% ORAL RINSE (MEDLINE KIT)
15.0000 mL | Freq: Two times a day (BID) | OROMUCOSAL | Status: DC
  Administered 2016-11-14 – 2016-11-19 (×12): 15 mL via OROMUCOSAL

## 2016-11-14 MED ORDER — INSULIN ASPART 100 UNIT/ML ~~LOC~~ SOLN
0.0000 [IU] | SUBCUTANEOUS | Status: DC
  Administered 2016-11-14: 3 [IU] via SUBCUTANEOUS
  Administered 2016-11-15: 1 [IU] via SUBCUTANEOUS
  Administered 2016-11-15: 2 [IU] via SUBCUTANEOUS
  Administered 2016-11-15: 1 [IU] via SUBCUTANEOUS
  Administered 2016-11-15: 2 [IU] via SUBCUTANEOUS
  Administered 2016-11-15 – 2016-11-18 (×8): 1 [IU] via SUBCUTANEOUS

## 2016-11-14 MED ORDER — ORAL CARE MOUTH RINSE
15.0000 mL | OROMUCOSAL | Status: DC
  Administered 2016-11-14 – 2016-11-19 (×51): 15 mL via OROMUCOSAL

## 2016-11-14 MED ORDER — LABETALOL HCL 100 MG PO TABS
100.0000 mg | ORAL_TABLET | Freq: Three times a day (TID) | ORAL | Status: DC
  Administered 2016-11-14 – 2016-11-15 (×4): 100 mg
  Filled 2016-11-14 (×4): qty 1

## 2016-11-14 MED ORDER — PRO-STAT SUGAR FREE PO LIQD
30.0000 mL | Freq: Two times a day (BID) | ORAL | Status: DC
  Administered 2016-11-14 – 2016-11-15 (×3): 30 mL
  Filled 2016-11-14 (×3): qty 30

## 2016-11-14 NOTE — Progress Notes (Signed)
Patient ID: Jake Jacobs, male   DOB: 01/14/1941, 76 y.o.   MRN: [id number]  BP (!) 149/67   Pulse (!) 58   Temp 97.4 F (36.3 C) (Axillary)   Resp 16   Ht 5\' 11"  (1.803 m)   Wt 71.2 kg (156 lb 15.5 oz)   SpO2 100%   BMI 21.89 kg/m  Not following commands Purposeful movements, tremor like activity right upper extremity Perrl, conjugate gaze, right deviation to eyes Head ct unchanged. Ventricular catheter in place, draining. No new recommendations.

## 2016-11-14 NOTE — Progress Notes (Signed)
eLink Physician-Brief Progress Note Patient Name: Jake Jacobs DOB: 11/03/1941 MRN: [id number]   Date of Service  11/14/2016  HPI/Events of Note  Glu 218 now on TF Had normal last checked  eICU Interventions  Add ssi sens     Intervention Category Intermediate Interventions: Hyperglycemia - evaluation and treatment  Nelda BucksFEINSTEIN,Uchenna Seufert J. 11/14/2016, 6:43 PM

## 2016-11-14 NOTE — Progress Notes (Signed)
OT Cancellation Note  Patient Details Name: Kelvin Cellarrthur Younge MRN: [id number] DOB: 07/31/1941   Cancelled Treatment:    Reason Eval/Treat Not Completed: Patient not medically ready (Active bedrest orders. Pt currently intubated/sedated).  Gaye AlkenBailey A Laquiesha Piacente M.S., OTR/L Pager: [phone]  11/14/2016, 8:45 AM

## 2016-11-14 NOTE — Progress Notes (Signed)
PULMONARY / CRITICAL CARE MEDICINE   Name: Jake Jacobs MRN: [id number] DOB: 04/10/1941    ADMISSION DATE:  05-Dec-2016 CONSULTATION DATE:  12/312  REFERRING MD:  Denton Lank   CHIEF COMPLAINT:  ICH  SUBJECTIVE:  Heavily sedated  VITAL SIGNS: BP (!) 162/84   Pulse 81   Temp 97.4 F (36.3 C) (Axillary)   Resp (!) 23   Ht 5\' 11"  (1.803 m)   Wt 156 lb 15.5 oz (71.2 kg)   SpO2 100%   BMI 21.89 kg/m   HEMODYNAMICS:    VENTILATOR SETTINGS: Vent Mode: PRVC FiO2 (%):  [40 %-100 %] 40 % Set Rate:  [16 bmp] 16 bmp Vt Set:  [550 mL-600 mL] 600 mL PEEP:  [5 cmH20] 5 cmH20 Plateau Pressure:  [9 cmH20-28 cmH20] 28 cmH20  INTAKE / OUTPUT: I/O last 3 completed shifts: In: 1318.1 [I.V.:1268.1; IV Piggyback:50] Out: 145 [Drains:145]  PHYSICAL EXAMINATION: General:  76 year old white male. Sedated on vent  Neuro:  Sedated. Not following commands. Hemiplegic on left. Not following commands. Moving right  HEENT:  NCAT. Orally intubated. ICV in place  Cardiovascular:  RRR w/out MRG Lungs:  Clear to auscultation no accessory use equal chest rise Abdomen:  Soft, not tender + bowel sounds  Musculoskeletal:  Equal bulk  Skin:  Warm and dry  LABS:  BMET  Recent Labs Lab 12-05-16 1158 12/05/16 1207 11/14/16 0552  NA 136 139 138  K 3.7 3.7 3.6  CL 104 104 106  CO2 22  --  23  BUN 13 15 14   CREATININE 1.21 1.10 1.17  GLUCOSE 156* 162* 103*    Electrolytes  Recent Labs Lab Dec 05, 2016 1158 11/14/16 0552  CALCIUM 8.7* 8.2*  MG  --  2.2  PHOS  --  3.6    CBC  Recent Labs Lab 2016/12/05 1158 12/05/2016 1207 11/14/16 0552  WBC 8.9  --  9.8  HGB 14.7 13.6 12.2*  HCT 41.6 40.0 35.9*  PLT 163  --  163    Coag's  Recent Labs Lab 2016/12/05 1158  APTT 34  INR 1.07    Sepsis Markers No results for input(s): LATICACIDVEN, PROCALCITON, O2SATVEN in the last 168 hours.  ABG  Recent Labs Lab December 05, 2016 1355 11/14/16 0450  PHART 7.370 7.385  PCO2ART 44.3 43.9   PO2ART 459.0* 118*    Liver Enzymes  Recent Labs Lab 2016-12-05 1158  AST 20  ALT 11*  ALKPHOS 59  BILITOT 1.0  ALBUMIN 3.5    Cardiac Enzymes No results for input(s): TROPONINI, PROBNP in the last 168 hours.  Glucose  Recent Labs Lab 05-Dec-2016 1156  GLUCAP 156*    Imaging Ct Head Wo Contrast  Result Date: 11/14/2016 CLINICAL DATA:  76 year old male with acute intracranial hemorrhage status post EVD placement. Initial encounter. EXAM: CT HEAD WITHOUT CONTRAST TECHNIQUE: Contiguous axial images were obtained from the base of the skull through the vertex without intravenous contrast. COMPARISON:  2016-12-05. FINDINGS: Brain: Left superior approach EVD has been placed and terminates in the left lateral ventricle (series 4, image 31). Ventricular size and configuration has not significantly changed. The volume of hyperdense intraventricular blood has slightly increased. No subarachnoid or other extra-axial hemorrhage. Heterogeneous mostly hyperdense hemorrhage centered at the right deep gray matter nuclei now encompasses 41 x 42 x 45 mm with an estimated intra-axial blood volume of 39 mL, versus 32 mL previously. Regional mass effect is stable with leftward midline shift of 8-9 mm. Surrounding edema has not significantly changed.  Basilar cisterns remain patent. Stable gray-white matter differentiation elsewhere. 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 Vascular: Calcified atherosclerosis at the skull base. Skull: Left superior frontal burr hole. No other No acute osseous abnormality identified. Sinuses/Orbits: Visualized paranasal sinuses and mastoids are stable and well pneumatized. Other: Left superior frontal approach external ventricular drain. Minimal associated left scalp hematoma. Otherwise negative orbit and scalp soft tissues. IMPRESSION: 1. Status post EVD placement with no adverse features. Ventricular size has not significantly changed, and might therefore be at  baseline for this patient. The volume of intraventricular hemorrhage has minimally increased since yesterday. 2. Similar slight increase in the volume of intra-axial hemorrhage centered at the right deep gray matter nuclei. Estimated blood volume now 39 mL (versus 32 mL previously). 3. Regional mass effect and leftward midline shift of 8-9 mm have not significantly changed. 4. No new intracranial abnormality. Electronically Signed   By: Odessa Fleming M.D.   On: 11/14/2016 09:54   Dg Chest Portable 1 View  Result Date: 2016/11/14 CLINICAL DATA:  Endotracheal tube placement. EXAM: PORTABLE CHEST 1 VIEW COMPARISON:  None. FINDINGS: The heart size and mediastinal contours are within normal limits. Both lungs are clear. No pneumothorax or pleural effusion is noted. Endotracheal tube is seen projected over tracheal air shadow with distal tip 7 cm above the carina. Distal tip of nasogastric tube is seen at gastroesophageal junction. The visualized skeletal structures are unremarkable. IMPRESSION: Endotracheal tube in grossly good position. Distal tip of nasogastric tube seen in expected position of gastroesophageal junction. No acute cardiopulmonary abnormality seen. Electronically Signed   By: Lupita Raider, M.D.   On: November 14, 2016 13:33   Ct Head Code Stroke W/o Cm  Addendum Date: 11-14-16   ADDENDUM REPORT: 11/14/2016 12:56 ADDENDUM: Study discussed by telephone with Dr. Otelia Limes on 2016/11/14 at 12:56 . At 1234 hours. Electronically Signed   By: Odessa Fleming M.D.   On: 11/14/16 12:56   Result Date: 11-14-16 CLINICAL DATA:  Code stroke. 76 year old male found slumped over, last seen normal at 10 45 hours today. Initial encounter. EXAM: CT HEAD WITHOUT CONTRAST TECHNIQUE: Contiguous axial images were obtained from the base of the skull through the vertex without intravenous contrast. COMPARISON:  None. FINDINGS: Study is degraded by motion artifact. Brain: Hyperdense hemorrhage centered in the right deep gray  matter nuclei with secondary extension into the ventricular system, probably initially the right lateral ventricle. The lobulated intra-axial hemorrhage encompasses 40 mm diameter foreign estimated blood volume of 32 mL. Regional mass effect including about 8 mm of leftward midline shift. Moderate volume of intraventricular hemorrhage including in the third and fourth ventricles. There is acute ventriculomegaly suspected with mild transependymal edema. Superimposed nonspecific bilateral periventricular white matter hypodensity also suspected. Basilar cisterns are patent. There is a chronic appearing CSF density collection posterior to the right cerebellum measuring 10 mm in thickness. Vascular: No suspicious intracranial vascular hyperdensity. Skull: Motion artifact obscures detail at the skullbase, but no skull fracture is identified. Sinuses/Orbits: Clear. Other: Mildly disc conjugate gaze, otherwise negative orbits soft tissues. Visualized scalp soft tissues are within normal limits. ASPECTS [facility] Stroke Program Early CT Score) Total score (0-10 with 10 being normal): Not applicable, acute intracranial hemorrhage. IMPRESSION: 1. Acute right deep gray matter hemorrhage with estimated parenchymal blood volume of 32 mL. Regional mass effect with leftward midline shift of 8 mm. 2. Extension of hemorrhage into the ventricles with moderate volume IVH and acute ventriculomegaly suspected. 3. ASPECTS is not applicable, acute  intracranial hemorrhage. 4. Preliminary report relayed via text pager to Dr. Agnes LawrenceE. Lindzen on 11/08/2016 at 1219 hours, I am awaiting a return call at the time of this report. Electronically Signed: By: Odessa FlemingH  Hall M.D. On: 10/16/2016 12:22     STUDIES:  CT head 12/31: 1. Acute right deep gray matter hemorrhage w Regional mass effect with leftward midline shift of 8 mm.2. Extension of hemorrhage into the ventricles with moderate volume IVH and acute ventriculomegaly suspected. CT head 1/1: 1.  Status post EVD placement with no adverse features. Ventricular size has not significantly changed, and might therefore be at baseline for this patient. The volume of intraventricular hemorrhage has minimally increased since yesterday. 2. Similar slight increase in the volume of intra-axial hemorrhage centered at the right deep gray matter nuclei. Estimated blood volume now 39 mL (versus 32 mL previously). 3. Regional mass effect and leftward midline shift of 8-9 mm have not significantly changed. 4. No new intracranial abnormality.  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 Blood perhaps a little worse. Clinically about the same For today -start tubefeeds -add vt antihypertensives -cont IVC management per Neuro-surg -not ready for weaning  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  P:   RASS goal: -1 SBP < 160 IVC per N-surg Eventually MRI brain   PULMONARY A: Ventilatory dependence in setting of inability to protect airway  P:   Full vent support Intermittent CXR PAD protocol   CARDIOVASCULAR A:  HTN  P:  Tele SBP goal < 160  RENAL A:   No acute  P:   Trend bmp Gentle IVFs Strict I&O  GASTROINTESTINAL A:   Nausea/vomiting (d/t increased ICP?) P:   NPO Start tubefeeds  HEMATOLOGIC A:   No acute  P:  scds in setting of ICH Trend CBC Transfuse per ICU protocol   INFECTIOUS A:   No acute  P:   Trend CBC & fever curve  ENDOCRINE A:   Mild hyperglycemia  P:   ssi if persistently > 180     FAMILY  - Updates:   - Inter-disciplinary family meet or Palliative Care meeting due by:  1/6   My ccm time 35 minutes Simonne MartinetPeter E Babcock ACNP-BC [facility]Critical [facility] Pager # [phone] OR # [phone] if no answer 11/14/2016, 10:35 AM  Attending Note:  I have examined patient,  reviewed labs, studies and notes. I have discussed the case with Kreg ShropshireP Babcock, and I agree with the data and plans as amended above. 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 He underwent urgent ventric drain in the ED by NSGY. On my eval he remains heavily sedated. He is ventilated, has a clear lung exam. ventric site is CDI - no drainage seen in reservoir yet. Will continue mechanical ventilation. Start TF's and adjust antihypertensive regimen. MS precludes weaning at this time. reimaging per NSGY plans.  Independent critical care time is 33 minutes.   Levy Pupaobert Mariaguadalupe Fialkowski, MD, PhD 11/14/2016, 10:38 PM [facility] [phone] or if no answer [phone]

## 2016-11-14 NOTE — Progress Notes (Signed)
PT Cancellation Note  Patient Details Name: Jake Jacobs MRN: [id number] DOB: 06/15/1941   Cancelled Treatment:    Reason Eval/Treat Not Completed: Medical issues which prohibited therapy (Pt intubated and sedated.  Nurse asked to cancel.)Also with active bedrest orders.  Will check back tomorrow and determine if pt appropriate for therapies.  Thanks.    [name] 11/14/2016, 8:48 AM [name],PT Acute Rehabilitation [phone] (pager)

## 2016-11-14 NOTE — Progress Notes (Addendum)
STROKE TEAM PROGRESS NOTE   HISTORY OF PRESENT ILLNESS (per record) Lem Peary is an 76 y.o. male who presented with acute onset of altered mental status and left hemiplegia. He was in his USOH at home working at the computer. He suddenly slumped over while sitting at computer about 10:45 AM. Witnessed by wife, who called daughter, who then called EMS. Vomited at home. En route to ED, EMS called a code stroke for new left sided weakness. Level of consciousness declined rapidly between the initial ED arrival and exam immediately following STAT CT head which showed R thalamic hemorrhage with IVH. Not able to protect airway. Sent to trauma bay for intubation. Will be admitted to MICU and Neurosurgery has evaluated the CT, considering placement of ventricular drain for acute hydrocephalus precipitated by intraventricular blood. Per daughter he has been sick the last few days with a severe cough, but no fever. Per daughter, he has no history of stroke or ICH. Patient was not administered IV t-PA secondary to ICH. ICH score = 3. He was admitted to the neuro ICU for further evaluation and treatment.   SUBJECTIVE (INTERVAL HISTORY) Wife, son and daughter are at bedside. They recounted history with me. Pt still intubated on sedation. Right UE tremor like activity, semi-purposeful movement, against gravity. CT repeat stable hematoma and IVH and hydrocephalus. EVD in place at 20 cmH2O   OBJECTIVE Temp:  [97.7 F (36.5 C)-100.9 F (38.3 C)] 98.5 F (36.9 C) (01/01 0400) Pulse Rate:  [53-94] 58 (01/01 0815) Cardiac Rhythm: Normal sinus rhythm;Sinus bradycardia (01/01 0800) Resp:  [15-24] 16 (01/01 0815) BP: (113-209)/(55-122) 149/67 (01/01 0730) SpO2:  [98 %-100 %] 100 % (01/01 0815) FiO2 (%):  [40 %-100 %] 40 % (01/01 0815) Weight:  [71.2 kg (156 lb 15.5 oz)-74.8 kg (165 lb)] 71.2 kg (156 lb 15.5 oz) (01/01 0600)  CBC:  Recent Labs Lab 11-24-2016 1158 24-Nov-2016 1207 11/14/16 0552  WBC 8.9  --  9.8   NEUTROABS 7.2  --   --   HGB 14.7 13.6 12.2*  HCT 41.6 40.0 35.9*  MCV 86.8  --  88.0  PLT 163  --  163    Basic Metabolic Panel:  Recent Labs Lab 11-24-16 1158 11-24-16 1207 11/14/16 0552  NA 136 139 138  K 3.7 3.7 3.6  CL 104 104 106  CO2 22  --  23  GLUCOSE 156* 162* 103*  BUN 13 15 14   CREATININE 1.21 1.10 1.17  CALCIUM 8.7*  --  8.2*  MG  --   --  2.2  PHOS  --   --  3.6    Lipid Panel:    Component Value Date/Time   TRIG 81 11/24/16 1300   HgbA1c: No results found for: HGBA1C Urine Drug Screen: No results found for: LABOPIA, COCAINSCRNUR, LABBENZ, AMPHETMU, THCU, LABBARB    IMAGING I have personally reviewed the radiological images below and agree with the radiology interpretations.  Dg Chest Portable 1 View 11-24-16  ndotracheal tube in grossly good position. Distal tip of nasogastric tube seen in expected position of gastroesophageal junction. No acute cardiopulmonary abnormality seen.   Ct Head Code Stroke W/o Cm 11/24/2016 1. Acute right deep gray matter hemorrhage with estimated parenchymal blood volume of 32 mL. Regional mass effect with leftward midline shift of 8 mm. 2. Extension of hemorrhage into the ventricles with moderate volume IVH and acute ventriculomegaly suspected. 3. ASPECTS is not applicable, acute intracranial hemorrhage.   TTE pending  EEG pending  PHYSICAL EXAM  Temp:  [97.4 F (36.3 C)-100.9 F (38.3 C)] 97.4 F (36.3 C) (01/01 0815) Pulse Rate:  [53-94] 59 (01/01 1100) Resp:  [15-26] 16 (01/01 1100) BP: (113-209)/(55-122) 149/66 (01/01 1100) SpO2:  [98 %-100 %] 99 % (01/01 1100) FiO2 (%):  [40 %-100 %] 40 % (01/01 0948) Weight:  [156 lb 15.5 oz (71.2 kg)-165 lb (74.8 kg)] 156 lb 15.5 oz (71.2 kg) (01/01 0600)  General - Well nourished, well developed, intubated on sedation.  Ophthalmologic - Fundi not visualized due to noncooperation.  Cardiovascular - Regular rate and rhythm.  Neuro - intubated and on sedation,  eyes closed and not open on voice or pain. Not following commands. PERRL, eyes mid position, doll's eye present, not blinking to visual threat bilaterally. Facial symmetry not able to tested. On pain stimulation, RUE against gravity with tremor like activity, RLE withdraw and against gravity. LUE and LLE hemiplegia. RUE increased muscle tone. DTR 1+ and no babinski. Sensation, coordination and gait not tested.    ASSESSMENT/PLAN Mr. Kelvin Cellarrthur Wallman is a 76 y.o. male with history of being "sick with severe cough" the past few days who developed acute altered mental status and L hemiplegia. CT showed R thalamic hemorrhage with IVH.   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  Neurosurgery (Nundkumar) placed EVD, currently at 20 cmH2O  Repeat CT head stable ICH with IVH and hydrocephalus  2D Echo  pending   LDL 106   HgbA1c pending  SCDs for VTE prophylaxis  Diet NPO time specified  aspirin 81 mg daily prior to admission, now not on antithrombotics due to ICH  Ongoing aggressive stroke risk factor management  Therapy recommendations:  pending   Disposition:  Pending  Obstructive hydrocephalus  NSG on board  S/p EVD  Repeat CT stable hydrocephalus  Cerebral edema  Mild midline shift on CT head  Consider hypertonic saline if needed  Acute Respiratory Failure  Secondary to ICH  Intubated in the ED  CCM on board  Hypertensive Emergency  BP elevated on admission, as high as 209/115  on cardene drip  Put on lisinopril and amlodipine for BP control  Hyperlipidemia  LDL 106, goal <70  Hold off statin for now due to ICH and IVH  Consider statin on discharge  Other Stroke Risk Factors  Advanced age  Hospital day # 1  This patient is critically ill due to right BG ICH with IVH and  hydrocephalus and at significant risk of neurological worsening, death form cerebral edema, brain herniation, hydrocephalus, hypertensive emergency. This patient's care requires constant monitoring of vital signs, hemodynamics, respiratory and cardiac monitoring, review of multiple databases, neurological assessment, discussion with family, other specialists and medical decision making of high complexity. I spent 45 minutes of neurocritical care time in the care of this patient.  Marvel PlanJindong Jonnathan Birman, MD PhD Stroke Neurology 11/14/2016 6:50 PM   To contact Stroke Continuity provider, please refer to [email or web address]. After hours, contact General Neurology

## 2016-11-14 NOTE — Progress Notes (Signed)
Upon arrival to ICU, informed pt's wife that it would be best if wedding band was removed and placed in her possession. Wife became distressed/tearful and asked if we could leave band on for the time being.   Ring remains on L ring finger and wife was educated about risk of being misplaced, etc.   Francia GreavesSavannah R Shiquan Mathieu, RN

## 2016-11-14 NOTE — Progress Notes (Signed)
SLP Cancellation Note  Patient Details Name: Jake Jacobs MRN: [id number] DOB: 01/18/1941   Cancelled treatment:       Reason Eval/Treat Not Completed: Medical issues which prohibited therapy (Intubated, signing off. please re consult when ready. )  Ferdinand LangoLeah Zayonna Ayuso MA, CCC-SLP [phone]  Keidrick Murty Meryl 11/14/2016, 8:39 AM

## 2016-11-14 DEATH — deceased

## 2016-11-15 ENCOUNTER — Inpatient Hospital Stay (HOSPITAL_COMMUNITY): Payer: Medicare Other

## 2016-11-15 DIAGNOSIS — I6789 Other cerebrovascular disease: Secondary | ICD-10-CM

## 2016-11-15 DIAGNOSIS — G936 Cerebral edema: Secondary | ICD-10-CM | POA: Diagnosis not present

## 2016-11-15 DIAGNOSIS — J9601 Acute respiratory failure with hypoxia: Secondary | ICD-10-CM

## 2016-11-15 DIAGNOSIS — Z9911 Dependence on respirator [ventilator] status: Secondary | ICD-10-CM | POA: Diagnosis not present

## 2016-11-15 DIAGNOSIS — I615 Nontraumatic intracerebral hemorrhage, intraventricular: Secondary | ICD-10-CM | POA: Diagnosis not present

## 2016-11-15 DIAGNOSIS — I610 Nontraumatic intracerebral hemorrhage in hemisphere, subcortical: Secondary | ICD-10-CM | POA: Diagnosis not present

## 2016-11-15 DIAGNOSIS — R4182 Altered mental status, unspecified: Secondary | ICD-10-CM | POA: Diagnosis not present

## 2016-11-15 DIAGNOSIS — G911 Obstructive hydrocephalus: Secondary | ICD-10-CM | POA: Diagnosis not present

## 2016-11-15 DIAGNOSIS — I10 Essential (primary) hypertension: Secondary | ICD-10-CM

## 2016-11-15 LAB — BASIC METABOLIC PANEL
ANION GAP: 9 (ref 5–15)
BUN: 33 mg/dL — AB (ref 6–20)
CALCIUM: 8.2 mg/dL — AB (ref 8.9–10.3)
CO2: 21 mmol/L — ABNORMAL LOW (ref 22–32)
Chloride: 107 mmol/L (ref 101–111)
Creatinine, Ser: 1.42 mg/dL — ABNORMAL HIGH (ref 0.61–1.24)
GFR calc Af Amer: 53 mL/min — ABNORMAL LOW (ref >60)
GFR calc non Af Amer: 46 mL/min — ABNORMAL LOW (ref >60)
GLUCOSE: 142 mg/dL — AB (ref 65–99)
POTASSIUM: 3.6 mmol/L (ref 3.5–5.1)
Sodium: 137 mmol/L (ref 135–145)

## 2016-11-15 LAB — CBC
HEMATOCRIT: 36.1 % — AB (ref 39.0–52.0)
Hemoglobin: 12.4 g/dL — ABNORMAL LOW (ref 13.0–17.0)
MCH: 30.1 pg (ref 26.0–34.0)
MCHC: 34.3 g/dL (ref 30.0–36.0)
MCV: 87.6 fL (ref 78.0–100.0)
Platelets: 165 10*3/uL (ref 150–400)
RBC: 4.12 MIL/uL — ABNORMAL LOW (ref 4.22–5.81)
RDW: 13.9 % (ref 11.5–15.5)
WBC: 12.1 10*3/uL — AB (ref 4.0–10.5)

## 2016-11-15 LAB — GLUCOSE, CAPILLARY
GLUCOSE-CAPILLARY: 136 mg/dL — AB (ref 65–99)
GLUCOSE-CAPILLARY: 139 mg/dL — AB (ref 65–99)
Glucose-Capillary: 120 mg/dL — ABNORMAL HIGH (ref 65–99)
Glucose-Capillary: 131 mg/dL — ABNORMAL HIGH (ref 65–99)
Glucose-Capillary: 144 mg/dL — ABNORMAL HIGH (ref 65–99)
Glucose-Capillary: 154 mg/dL — ABNORMAL HIGH (ref 65–99)
Glucose-Capillary: 162 mg/dL — ABNORMAL HIGH (ref 65–99)

## 2016-11-15 LAB — MAGNESIUM
Magnesium: 2.2 mg/dL (ref 1.7–2.4)
Magnesium: 2.1 mg/dL (ref 1.7–2.4)

## 2016-11-15 LAB — ECHOCARDIOGRAM COMPLETE
HEIGHTINCHES: 71 in
WEIGHTICAEL: 2751.34 [oz_av]

## 2016-11-15 LAB — HEMOGLOBIN A1C
HEMOGLOBIN A1C: 5 % (ref 4.8–5.6)
Mean Plasma Glucose: 97 mg/dL

## 2016-11-15 LAB — PHOSPHORUS
Phosphorus: 2.7 mg/dL (ref 2.5–4.6)
Phosphorus: 2.8 mg/dL (ref 2.5–4.6)

## 2016-11-15 MED ORDER — PANTOPRAZOLE SODIUM 40 MG PO TBEC: 40.0000 mg | DELAYED_RELEASE_TABLET | Freq: Every day | ORAL | Status: DC

## 2016-11-15 MED ORDER — PANTOPRAZOLE SODIUM 40 MG PO PACK
40.0000 mg | PACK | Freq: Every day | ORAL | Status: DC
  Administered 2016-11-16 – 2016-11-19 (×4): 40 mg
  Filled 2016-11-15 (×4): qty 20

## 2016-11-15 MED ORDER — VITAL AF 1.2 CAL PO LIQD
1000.0000 mL | ORAL | Status: DC
  Administered 2016-11-15 – 2016-11-18 (×4): 1000 mL

## 2016-11-15 MED ORDER — LABETALOL HCL 100 MG PO TABS
200.0000 mg | ORAL_TABLET | Freq: Three times a day (TID) | ORAL | Status: DC
  Administered 2016-11-15: 100 mg
  Administered 2016-11-15 – 2016-11-19 (×11): 200 mg
  Filled 2016-11-15 (×12): qty 2

## 2016-11-15 MED ORDER — HEPARIN SODIUM (PORCINE) 5000 UNIT/ML IJ SOLN
5000.0000 [IU] | Freq: Three times a day (TID) | INTRAMUSCULAR | Status: DC
  Administered 2016-11-16 – 2016-11-19 (×8): 5000 [IU] via SUBCUTANEOUS
  Filled 2016-11-15 (×8): qty 1

## 2016-11-15 NOTE — Progress Notes (Signed)
Initial Nutrition Assessment  INTERVENTION:   Vital AF 1.2 @ 65 ml/hr (1560 ml/day) Provides: 1872 kcal, 117 grams protein, and 1265 ml H2O.    NUTRITION DIAGNOSIS:   Inadequate oral intake related to inability to eat as evidenced by NPO status.  GOAL:   Patient will meet greater than or equal to 90% of their needs  MONITOR:   TF tolerance, Vent status, Labs  REASON FOR ASSESSMENT:   Consult, Ventilator Enteral/tube feeding initiation and management  ASSESSMENT:   Pt admitted from home with R thalamic hemorrhage with IVH due to hypertension. Pt had L frontal ventriculostomy placed 12/31.    OG tube in place with Vital High Protein @ 40 ml/hr and 30 ml Prostat BID  Patient is currently intubated on ventilator support MV: 9.9 L/min Temp (24hrs), Avg:99.6 F (37.6 C), Min:98.9 F (37.2 C), Max:100.8 F (38.2 C)  Propofol: off  Unable to complete Nutrition-Focused physical exam at this time. Pt leaving for stat CT Admission weight 165 lb (74.8 kg)   Diet Order:  Diet NPO time specified  Skin:  Reviewed, no issues  Last BM:  1/1  Height:   Ht Readings from Last 1 Encounters:  10/27/2016 5\' 11"  (1.803 m)    Weight:   Wt Readings from Last 1 Encounters:  11/15/16 171 lb 15.3 oz (78 kg)    Ideal Body Weight:  78.1 kg  BMI:  Body mass index is 23.98 kg/m.  Estimated Nutritional Needs:   Kcal:  1948  Protein:  95-115 grams  Fluid:  >2 L/day  EDUCATION NEEDS:   No education needs identified at this time  Kendell BaneHeather Brenae Lasecki RD, LDN, CNSC [phone] Pager [phone] After Hours Pager

## 2016-11-15 NOTE — Progress Notes (Signed)
PT Cancellation Note  Patient Details Name: Jake Jacobs MRN: [id number] DOB: 01/27/1941   Cancelled Treatment:    Reason Eval/Treat Not Completed: Patient not medically ready  Pt on bedrest. Will await increase in activity orders prior to PT evaluation.   [name] 11/15/2016, 8:31 AM Mylo RedShauna Lenn Volker, PT, DPT [phone]

## 2016-11-15 NOTE — Progress Notes (Signed)
OT Cancellation Note  Patient Details Name: Jake Jacobs MRN: [id number] DOB: 11/16/1940   Cancelled Treatment:    Reason Eval/Treat Not Completed: Patient not medically ready (bedrest)  Felecia ShellingJones, Taggart Prasad B   Micco Bourbeau, [name]   OTR/L Pager: [phone] Office: [phone] .  11/15/2016, 8:57 AM

## 2016-11-15 NOTE — Procedures (Signed)
ELECTROENCEPHALOGRAM REPORT  Date of Study: 11/15/2016  Patient's Name: Jake Jacobs MRN: [id number] Date of Birth: 23-Mar-1941  Referring Provider: Rosalin Hawking, MD  Clinical History: 76 year old man with right thalamic hemorrhage and hydrocephalus requiring ventriculostomy.  Medications:  0.9 % sodium chloride infusion  L1 acetaminophen (TYLENOL) solution 650 mg  L1 acetaminophen (TYLENOL) suppository 650 mg  L1 acetaminophen (TYLENOL) tablet 650 mg   amLODipine (NORVASC) tablet 10 mg   chlorhexidine gluconate (MEDLINE KIT) (PERIDEX) 0.12 % solution 15 mL   feeding supplement (PRO-STAT SUGAR FREE 64) liquid 30 mL   feeding supplement (VITAL HIGH PROTEIN) liquid 1,000 mL   fentaNYL (SUBLIMAZE) injection 50 mcg   fentaNYL (SUBLIMAZE) injection 50 mcg   insulin aspart (novoLOG) injection 0-9 Units   labetalol (NORMODYNE) tablet 100 mg   labetalol (NORMODYNE,TRANDATE) injection 10-40 mg   lisinopril (PRINIVIL,ZESTRIL) tablet 20 mg   MEDLINE mouth rinse  L2 nicardipine (CARDENE) 74m in 0.86% saline 2055mIV infusion (0.1 mg/ml)   pantoprazole (PROTONIX) EC tablet 40 mg   propofol (DIPRIVAN) 1000 MG/100ML infusion   senna-docusate (Senokot-S) tablet 1 tablet   Technical Summary: A multichannel digital EEG recording measured by the international 10-20 system with electrodes applied with paste and impedances below 5000 ohms performed as portable with EKG monitoring in an intubabted and sedated patient.  Hyperventilation and photic stimulation were not performed.  The digital EEG was referentially recorded, reformatted, and digitally filtered in a variety of bipolar and referential montages for optimal display.   Description: The patient is unresponsive and sedated on propofol during the recording.  There is diffuse background slowing of 4-5 Hz theta and 2-3 Hz delta activity without a discernible posterior dominant rhythm.  Vertex waves and symmetric sleep spindles were not seen.   Patient reportedly exhibited some extensor movements during the recording, without electrographic correlation.  There were no epileptiform discharges or electrographic seizures seen.    EKG lead was unremarkable.  Impression: This sedated EEG is abnormal due to diffuse slowing of the waking background.  Clinical Correlation of the above findings indicates diffuse cerebral dysfunction that is non-specific in etiology and can be seen with hypoxic/ischemic injury, toxic/metabolic encephalopathies, neurodegenerative disorders, or medication effect.  Clinical correlation is advised.   AdMetta ClinesDO

## 2016-11-15 NOTE — Progress Notes (Signed)
Pt taken off wean by NP around 10:30.

## 2016-11-15 NOTE — Progress Notes (Signed)
   11/15/16 1100  Clinical Encounter Type  Visited With Patient and family together  Visit Type Follow-up  Referral From Nurse  Spiritual Encounters  Spiritual Needs Emotional  Stress Factors  Patient Stress Factors Health changes  Family Stress Factors Exhausted;Family relationships;Health changes

## 2016-11-15 NOTE — Progress Notes (Signed)
EEG completed; results pending.    

## 2016-11-15 NOTE — Progress Notes (Signed)
*  PRELIMINARY RESULTS* Echocardiogram 2D Echocardiogram has been performed.  Arvil ChacoFoster, Mickey Esguerra 11/15/2016, 3:51 PM

## 2016-11-15 NOTE — Progress Notes (Signed)
During 0100 neuro assessment, found pt to have a change in neuro status. RUE had been purposeful since admission, but had decreased in movement and began flexing/extending abnormally at 0100. LUE had non-purposeful/abnormal extension/flexion already at that time. Notified on-call neurologist change and MD ordered stat head CT w/o contrast.   Took pt down to CT by 0130 and no further neuro changes have occurred at this time.  Will continue to monitor closely.  Francia GreavesSavannah R Andrez Lieurance, RN

## 2016-11-15 NOTE — Progress Notes (Signed)
IVC drain initially at 15mmHg. Verbal order at bedside to decrease to 10mmHg. Drain went from 7mL to 27mL. MD aware. Keyen Marban, [place] ScrapeSarah E, RN

## 2016-11-15 NOTE — Progress Notes (Signed)
Was noted to have decreased spontaneous movements of RUE last night prompting CT scan.  EXAM:  BP (!) 147/70   Pulse 84   Temp 98.9 F (37.2 C) (Axillary)   Resp (!) 23   Ht 5\' 11"  (1.803 m)   Wt 78 kg (171 lb 15.3 oz) Comment: unable to get lower reading; multiple attempts  SpO2 98%   BMI 23.98 kg/m   Intubated, on propofol: No eye opening to pain No movement BUE/BLE to pain EVD in place, blood tinged CSF  IMPRESSION:  76 y.o. male with likely hypertensive right thalamic hemorrhage with IVH. EVD in place and functional.  PLAN: - Will drop drain to 10mmHg

## 2016-11-15 NOTE — Progress Notes (Signed)
PULMONARY / CRITICAL CARE MEDICINE   Name: Jake Jacobs MRN: [id number] DOB: [birth year]    ADMISSION DATE:  Dec 13, 2016 CONSULTATION DATE:  12/312  REFERRING MD:  Denton Lank   CHIEF COMPLAINT:  ICH  SUBJECTIVE:  Heavily sedated  VITAL SIGNS: BP (!) 156/78   Pulse 79   Temp 98.9 F (37.2 C) (Axillary)   Resp (!) 25   Ht 5\' 11"  (1.803 m)   Wt 171 lb 15.3 oz (78 kg) Comment: unable to get lower reading; multiple attempts  SpO2 100%   BMI 23.98 kg/m   HEMODYNAMICS:    VENTILATOR SETTINGS: Vent Mode: CPAP;PSV FiO2 (%):  [40 %] 40 % Set Rate:  [16 bmp] 16 bmp Vt Set:  [600 mL] 600 mL PEEP:  [5 cmH20] 5 cmH20 Pressure Support:  [5 cmH20-8 cmH20] 8 cmH20 Plateau Pressure:  [14 cmH20-17 cmH20] 17 cmH20  INTAKE / OUTPUT: I/O last 3 completed shifts: In: 4834.4 [I.V.:3884.4; NG/GT:800; IV Piggyback:150] Out: 974 [Urine:700; Drains:274]  PHYSICAL EXAMINATION: General:  76 year old white male. Sedated on vent  Neuro:  Sedated. Not following commands. Hemiplegic on left. Not following commands. Now w/ posturing on right  HEENT:  NCAT. Orally intubated. ICV in place  Cardiovascular:  RRR w/out MRG Lungs:  Clear to auscultation no accessory use equal chest rise Abdomen:  Soft, not tender + bowel sounds  Musculoskeletal:  Equal bulk  Skin:  Warm and dry  LABS:  BMET  Recent Labs Lab 12-13-2016 1158 13-Dec-2016 1207 11/14/16 0552 11/15/16 0429  NA 136 139 138 137  K 3.7 3.7 3.6 3.6  CL 104 104 106 107  CO2 22  --  23 21*  BUN 13 15 14  33*  CREATININE 1.21 1.10 1.17 1.42*  GLUCOSE 156* 162* 103* 142*    Electrolytes  Recent Labs Lab 12-13-2016 1158  11/14/16 0552 11/14/16 1104 11/14/16 1701 11/15/16 0429  CALCIUM 8.7*  --  8.2*  --   --  8.2*  MG  --   < > 2.2 2.1 2.1 2.2  PHOS  --   < > 3.6 3.1 2.1* 2.7  < > = values in this interval not displayed.  CBC  Recent Labs Lab Dec 13, 2016 1158 12-13-2016 1207 11/14/16 0552 11/15/16 0429  WBC 8.9  --  9.8 12.1*   HGB 14.7 13.6 12.2* 12.4*  HCT 41.6 40.0 35.9* 36.1*  PLT 163  --  163 165    Coag's  Recent Labs Lab 2016-12-13 1158  APTT 34  INR 1.07    Sepsis Markers No results for input(s): LATICACIDVEN, PROCALCITON, O2SATVEN in the last 168 hours.  ABG  Recent Labs Lab Dec 13, 2016 1355 11/14/16 0450  PHART 7.370 7.385  PCO2ART 44.3 43.9  PO2ART 459.0* 118*    Liver Enzymes  Recent Labs Lab 2016-12-13 1158  AST 20  ALT 11*  ALKPHOS 59  BILITOT 1.0  ALBUMIN 3.5    Cardiac Enzymes No results for input(s): TROPONINI, PROBNP in the last 168 hours.  Glucose  Recent Labs Lab 11/14/16 1146 11/14/16 1544 11/14/16 2015 11/15/16 0026 11/15/16 0422 11/15/16 0808  GLUCAP 97 218* 219* 154* 136* 120*    Imaging Ct Head Wo Contrast  Result Date: 11/15/2016 CLINICAL DATA:  Follow-up thalamic hemorrhage. Change in mental status. EXAM: CT HEAD WITHOUT CONTRAST TECHNIQUE: Contiguous axial images were obtained from the base of the skull through the vertex without intravenous contrast. COMPARISON:  CT HEAD November 14, 2016 FINDINGS: BRAIN: Similar appearance of multilobulated RIGHT thalamic basal ganglia  intraparenchymal hematoma with surrounding low-density vasogenic edema. Intraventricular extension, similar moderate to severe hydrocephalus with the LEFT ventriculostomy catheter which terminates in the LEFT frontal horn. New small amount of hemorrhage along the ventriculostomy catheter within LEFT frontal lobe. Unchanged 9 mm RIGHT to LEFT midline shift. No acute large vascular territory infarcts. Patchy white matter changes compatible with chronic small vessel ischemic disease. Low-density old subdural hematoma versus hygroma RIGHT posterior fossa. VASCULAR: Mild calcific atherosclerosis calcific atherosclerosis of the carotid siphons. SKULL: No skull fracture. LEFT frontal probable. Small LEFT frontal scalp hematoma. SINUSES/ORBITS: The mastoid air-cells and included paranasal sinuses are  well-aerated.The included ocular globes and orbital contents are non-suspicious. OTHER: Life-support lines in place. IMPRESSION: New small volume hemorrhage along LEFT ventriculostomy catheter tract. Similar moderate to severe hydrocephalus and intraventricular hemorrhage. Evolving large RIGHT thalamic/basal ganglia hematoma. Similar 9 mm RIGHT to LEFT midline shift. Electronically Signed   By: Awilda Metro M.D.   On: 11/15/2016 02:47   Dg Chest Port 1 View  Result Date: 11/15/2016 CLINICAL DATA:  Acute intracranial hemorrhage.  Intubated patient. EXAM: PORTABLE CHEST 1 VIEW COMPARISON:  10/30/2016 FINDINGS: Endotracheal tube tip now projects 3 cm above the carina, well positioned. Nasal/ orogastric tube passes below the diaphragm into the stomach. There are reticular type opacities at the lung bases likely combination scarring and subsegmental atelectasis. There are few small scattered fairly dense nodules consistent with healed granuloma, stable. Lungs are hyperexpanded but otherwise clear. No pleural effusion or pneumothorax. Cardiac silhouette is normal in size. No mediastinal or hilar masses. Left hilar calcified nodes in AP window nodes are stable. IMPRESSION: 1. No acute cardiopulmonary disease. 2. Endotracheal tube well positioned, tip 3 cm above the carina. No other change from the prior study. Electronically Signed   By: Amie Portland M.D.   On: 11/15/2016 09:47     STUDIES:  CT head 12/31: 1. Acute right deep gray matter hemorrhage w Regional mass effect with leftward midline shift of 8 mm.2. Extension of hemorrhage into the ventricles with moderate volume IVH and acute ventriculomegaly suspected. CT head 1/1: 1. Status post EVD placement with no adverse features. Ventricular size has not significantly changed, and might therefore be at baseline for this patient. The volume of intraventricular hemorrhage has minimally increased since yesterday. 2. Similar slight increase in the volume of  intra-axial hemorrhage centered at the right deep gray matter nuclei. Estimated blood volume now 39 mL (versus 32 mL previously). 3. Regional mass effect and leftward midline shift of 8-9 mm have not significantly changed. 4. No new intracranial abnormality. CT head 1/2: IMPRESSION:New small volume hemorrhage along LEFT ventriculostomy catheter tract. Similar moderate to severe hydrocephalus and intraventricular hemorrhage.Evolving large RIGHT thalamic/basal ganglia hematoma. Similar 9 mm RIGHT to LEFT midline shift.  CULTURES: Blood 1/2>>>  ANTIBIOTICS:   SIGNIFICANT EVENTS:   LINES/TUBES: OETT 12/31>>> IVC 12/31>>>  DISCUSSION: Right ICH w/ IVH and obstructive hydrocephalus. Blood perhaps a little worse. Clinically worse w/ both radiographic and clinically. Now posturing and no longer purposeful on right.   -cont tubefeeds -cont antihypertensives -cont IVC management per Neuro-surg -try to wean off diprivan  -gentle hydration (cratinine worse); stop ace-I - rx fever - culture blood and would like CSF (CXR clear so unlikely pulmonary) -not ready for weaning  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  ->neuro exam a little worse. Now w/ posturing on right but not  P:   RASS goal: -1 Will  see what happens off diprivan  SBP < 160 IVC per N-surg Eventually MRI brain   PULMONARY A: Ventilatory dependence in setting of inability to protect airway  P:   Full vent support Intermittent CXR PAD protocol   CARDIOVASCULAR A:  HTN  P:  Tele SBP goal < 160  RENAL A:   Mild AKI P:   Trend bmp Gentle IVFs Strict I&O  GASTROINTESTINAL A:   Nausea/vomiting (d/t increased ICP?) P:   NPO Start tubefeeds  HEMATOLOGIC A:   No acute  P:  scds in setting of ICH Trend CBC Transfuse per ICU protocol   INFECTIOUS A:   Fever, mild increase in leukocytosis  P:   Trend CBC & fever curve Culture blood and  CSF  ENDOCRINE A:   Mild hyperglycemia  P:   ssi    FAMILY  - Updates:   - Inter-disciplinary family meet or Palliative Care meeting due by:  1/6   My ccm time 35 minutes Simonne MartinetPeter E Babcock ACNP-BC [facility] Pulmonary/Critical Care Pager # [phone] OR # [phone] if no answer 11/15/2016, 10:17 AM   ATTENDING NOTE / ATTESTATION NOTE :   I have discussed the case with the resident/APP Anders SimmondsPete Babcock NP.   I agree with the resident/APP's  history, physical examination, assessment, and plans.    I have edited the above note and modified it according to our agreed history, physical examination, assessment and plan.    Briefly, pt admitted with a large R thalamic/BG ICH with IVH, with R to L midline shift,  likely HTNsive bleed. S/P Ventricular drain. Intubated for airway protection. CT scan of head 1/2 with new hemorrhage per ventriculostomy.  Pt just had a rpt cranial ct scan.   Pt seen. Sedated. Intubated. Comfortable. BP 120/60, HR 70. (-) NVD. Good ae. CTA. ggod s1/s2. (-) m. (+) BS, soft, NT. (-) edema. Limited neuro exam as pt is sedated. Posturing noted.  Labs reviewed.  Assessment and Plan: Acute R BG/Thalamic ICH/IVH with midline shift. S/P ventriculostomy drain 12/31.  Prognosis is guarded. Now with posturing. Rpt cranial CT scan is pending. Neurology and Neurosurgery seeing pt.  Not sure if pt needs to be on hypertonic saline for cerebral edema. Will defer to neurology. Cont meds. Cont Ventriculostomy.   Acute hypoxemic resp failure 2/2 unable to protect airway.  Cont vent support. Not ready for wean.   I have spent 30 minutes of critical care time with this patient today.  Family :Family updated at length today. Spoke to wife and brother and daughter.  Guarded prognosis and I explained to family.    Pollie MeyerJ. Angelo A de Dios, MD 11/15/2016, 1:22 PM [place] [facility] Pager [phone] After 3 pm or if no answer, call [phone]

## 2016-11-15 NOTE — Progress Notes (Signed)
RT assisted in patient transport on vent to CT and back to [facility]. Pt's vitals remained stable throughout the trip.

## 2016-11-15 NOTE — Progress Notes (Signed)
STROKE TEAM PROGRESS NOTE   SUBJECTIVE (INTERVAL HISTORY) Wife, daughter and other family memebers are at bedside. Pt still intubated on sedation. Neuro less responsive at yesterday. Right UE extension on pain stimulation. CT repeat however showed stable hematoma, IVH, hydrocephalus and midline shift. EVD in place and threshold changed to 10 mmHg   OBJECTIVE Temp:  [98.9 F (37.2 C)-100.8 F (38.2 C)] 100.1 F (37.8 C) (01/02 1200) Pulse Rate:  [43-96] 72 (01/02 1400) Cardiac Rhythm: Normal sinus rhythm (01/02 0800) Resp:  [15-32] 16 (01/02 1400) BP: (106-202)/(48-99) 126/59 (01/02 1400) SpO2:  [95 %-100 %] 99 % (01/02 1400) FiO2 (%):  [40 %] 40 % (01/02 1330) Weight:  [171 lb 15.3 oz (78 kg)] 171 lb 15.3 oz (78 kg) (01/02 0400)  CBC:  Recent Labs Lab 10/21/2016 1158  11/14/16 0552 11/15/16 0429  WBC 8.9  --  9.8 12.1*  NEUTROABS 7.2  --   --   --   HGB 14.7  < > 12.2* 12.4*  HCT 41.6  < > 35.9* 36.1*  MCV 86.8  --  88.0 87.6  PLT 163  --  163 165  < > = values in this interval not displayed.  Basic Metabolic Panel:  Recent Labs Lab 11/14/16 0552  11/14/16 1701 11/15/16 0429  NA 138  --   --  137  K 3.6  --   --  3.6  CL 106  --   --  107  CO2 23  --   --  21*  GLUCOSE 103*  --   --  142*  BUN 14  --   --  33*  CREATININE 1.17  --   --  1.42*  CALCIUM 8.2*  --   --  8.2*  MG 2.2  < > 2.1 2.2  PHOS 3.6  < > 2.1* 2.7  < > = values in this interval not displayed.  Lipid Panel:     Component Value Date/Time   CHOL 166 11/14/2016 0844   TRIG 77 11/14/2016 0844   HDL 45 11/14/2016 0844   CHOLHDL 3.7 11/14/2016 0844   VLDL 15 11/14/2016 0844   LDLCALC 106 (H) 11/14/2016 0844   HgbA1c:  Lab Results  Component Value Date   HGBA1C 5.0 11/14/2016   Urine Drug Screen:     Component Value Date/Time   LABOPIA NONE DETECTED 11/14/2016 1121   COCAINSCRNUR NONE DETECTED 11/14/2016 1121   LABBENZ NONE DETECTED 11/14/2016 1121   AMPHETMU NONE DETECTED 11/14/2016  1121   THCU NONE DETECTED 11/14/2016 1121   LABBARB NONE DETECTED 11/14/2016 1121      IMAGING I have personally reviewed the radiological images below and agree with the radiology interpretations.  Dg Chest Portable 1 View 11/12/2016  ndotracheal tube in grossly good position. Distal tip of nasogastric tube seen in expected position of gastroesophageal junction. No acute cardiopulmonary abnormality seen.   Ct Head Code Stroke W/o Cm 11/07/2016 1. Acute right deep gray matter hemorrhage with estimated parenchymal blood volume of 32 mL. Regional mass effect with leftward midline shift of 8 mm. 2. Extension of hemorrhage into the ventricles with moderate volume IVH and acute ventriculomegaly suspected. 3. ASPECTS is not applicable, acute intracranial hemorrhage.   TTE pending  EEG This sedated EEG is abnormal due to diffuse slowing of the waking background. Clinical Correlation of the above findings indicates diffuse cerebral dysfunction that is non-specific in etiology and can be seen with hypoxic/ischemic injury, toxic/metabolic encephalopathies, neurodegenerative disorders, or medication effect.  Clinical correlation is advised.   PHYSICAL EXAM  Temp:  [98.9 F (37.2 C)-100.8 F (38.2 C)] 100.1 F (37.8 C) (01/02 1200) Pulse Rate:  [43-96] 72 (01/02 1400) Resp:  [15-32] 16 (01/02 1400) BP: (106-202)/(48-99) 126/59 (01/02 1400) SpO2:  [95 %-100 %] 99 % (01/02 1400) FiO2 (%):  [40 %] 40 % (01/02 1330) Weight:  [171 lb 15.3 oz (78 kg)] 171 lb 15.3 oz (78 kg) (01/02 0400)  General - Well nourished, well developed, intubated on sedation.  Ophthalmologic - Fundi not visualized due to noncooperation.  Cardiovascular - Regular rate and rhythm.  Neuro - intubated and on sedation, eyes closed and not open on voice or pain. Not following commands. Bilateral pupil small 1.29mm, but PERRL, eyes mid position, doll's eye sluggish, not blinking to visual threat bilaterally, corneal weak  L>R, positive gag and cough. Facial symmetry not able to tested. On pain stimulation, RUE extention, RLE with distal withdraw but not able to against gravity. LUE and LLE hemiplegia. RUE increased muscle tone. DTR 1+ and no babinski. Sensation, coordination and gait not tested.    ASSESSMENT/PLAN Mr. Jake Jacobs is a 76 y.o. male with history of being "sick with severe cough" the past few days who developed acute altered mental status and L hemiplegia. CT showed R thalamic hemorrhage with IVH.   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  Neurosurgery Conchita Paris) placed EVD, currently at 20 cmH2O  Repeat CT head 11/14/16 stable ICH with IVH and hydrocephalus  Repeat CT head 11/15/16 stable ICH with IVH and hydrocephalus and stable midline shift  2D Echo  pending   EEG diffuse slowing, no seizure  LDL 106   HgbA1c 5.0  Heparin subq for VTE prophylaxis Diet NPO time specified  aspirin 81 mg daily prior to admission, now not on antithrombotics due to ICH  Ongoing aggressive stroke risk factor management  Therapy recommendations:  pending   Disposition:  Pending  Obstructive hydrocephalus  NSG on board  S/p EVD  Repeat CT stable hydrocephalus  Cerebral edema  Mild midline shift on CT head  Repeat CT showed stable midline shift at 8mm  No significant change from last CT head  Hold off hypertonic saline for now  Acute Respiratory Failure  Secondary to ICH  Intubated in the ED  CCM on board  Hypertensive Emergency  BP elevated on admission, as high as 209/115  off cardene drip now  Put on lisinopril, labetalol and amlodipine for BP control  Hyperlipidemia  LDL 106, goal <70  Hold off statin for now due to ICH and IVH  Consider statin on discharge  Other Stroke Risk  Factors  Advanced age  Hospital day # 2  This patient is critically ill due to right BG ICH with IVH and hydrocephalus and at significant risk of neurological worsening, death form cerebral edema, brain herniation, hydrocephalus, hypertensive emergency. This patient's care requires constant monitoring of vital signs, hemodynamics, respiratory and cardiac monitoring, review of multiple databases, neurological assessment, discussion with family, other specialists and medical decision making of high complexity. I spent 40 minutes of neurocritical care time in the care of this patient.  Marvel Plan, MD PhD Stroke Neurology 11/15/2016 2:50 PM   To contact Stroke Continuity provider, please refer to [email or web address]. After hours, contact General Neurology

## 2016-11-16 DIAGNOSIS — I10 Essential (primary) hypertension: Secondary | ICD-10-CM | POA: Diagnosis not present

## 2016-11-16 DIAGNOSIS — J9601 Acute respiratory failure with hypoxia: Secondary | ICD-10-CM | POA: Diagnosis not present

## 2016-11-16 DIAGNOSIS — I619 Nontraumatic intracerebral hemorrhage, unspecified: Secondary | ICD-10-CM

## 2016-11-16 DIAGNOSIS — G911 Obstructive hydrocephalus: Secondary | ICD-10-CM | POA: Diagnosis not present

## 2016-11-16 DIAGNOSIS — I610 Nontraumatic intracerebral hemorrhage in hemisphere, subcortical: Secondary | ICD-10-CM | POA: Diagnosis not present

## 2016-11-16 DIAGNOSIS — E785 Hyperlipidemia, unspecified: Secondary | ICD-10-CM | POA: Diagnosis not present

## 2016-11-16 DIAGNOSIS — I615 Nontraumatic intracerebral hemorrhage, intraventricular: Secondary | ICD-10-CM | POA: Diagnosis not present

## 2016-11-16 LAB — BLOOD CULTURE ID PANEL (REFLEXED)
ACINETOBACTER BAUMANNII: NOT DETECTED
CANDIDA ALBICANS: NOT DETECTED
Candida glabrata: NOT DETECTED
Candida krusei: NOT DETECTED
Candida parapsilosis: NOT DETECTED
Candida tropicalis: NOT DETECTED
ENTEROBACTERIACEAE SPECIES: NOT DETECTED
ENTEROCOCCUS SPECIES: NOT DETECTED
Enterobacter cloacae complex: NOT DETECTED
Escherichia coli: NOT DETECTED
HAEMOPHILUS INFLUENZAE: NOT DETECTED
KLEBSIELLA OXYTOCA: NOT DETECTED
Klebsiella pneumoniae: NOT DETECTED
LISTERIA MONOCYTOGENES: NOT DETECTED
METHICILLIN RESISTANCE: NOT DETECTED
NEISSERIA MENINGITIDIS: NOT DETECTED
PSEUDOMONAS AERUGINOSA: NOT DETECTED
Proteus species: NOT DETECTED
STREPTOCOCCUS AGALACTIAE: NOT DETECTED
STREPTOCOCCUS PNEUMONIAE: NOT DETECTED
STREPTOCOCCUS PYOGENES: NOT DETECTED
STREPTOCOCCUS SPECIES: NOT DETECTED
Serratia marcescens: NOT DETECTED
Staphylococcus aureus (BCID): NOT DETECTED
Staphylococcus species: DETECTED — AB

## 2016-11-16 LAB — CBC
HCT: 33.9 % — ABNORMAL LOW (ref 39.0–52.0)
Hemoglobin: 11.1 g/dL — ABNORMAL LOW (ref 13.0–17.0)
MCH: 29.8 pg (ref 26.0–34.0)
MCHC: 32.7 g/dL (ref 30.0–36.0)
MCV: 90.9 fL (ref 78.0–100.0)
PLATELETS: 138 10*3/uL — AB (ref 150–400)
RBC: 3.73 MIL/uL — AB (ref 4.22–5.81)
RDW: 14.6 % (ref 11.5–15.5)
WBC: 11.8 10*3/uL — ABNORMAL HIGH (ref 4.0–10.5)

## 2016-11-16 LAB — GLUCOSE, CAPILLARY
GLUCOSE-CAPILLARY: 133 mg/dL — AB (ref 65–99)
Glucose-Capillary: 133 mg/dL — ABNORMAL HIGH (ref 65–99)
Glucose-Capillary: 127 mg/dL — ABNORMAL HIGH (ref 65–99)
Glucose-Capillary: 101 mg/dL — ABNORMAL HIGH (ref 65–99)
Glucose-Capillary: 134 mg/dL — ABNORMAL HIGH (ref 65–99)
Glucose-Capillary: 136 mg/dL — ABNORMAL HIGH (ref 65–99)

## 2016-11-16 LAB — COMPREHENSIVE METABOLIC PANEL
ALT: 15 U/L — ABNORMAL LOW (ref 17–63)
ANION GAP: 5 (ref 5–15)
AST: 23 U/L (ref 15–41)
Albumin: 2.5 g/dL — ABNORMAL LOW (ref 3.5–5.0)
Alkaline Phosphatase: 39 U/L (ref 38–126)
BUN: 39 mg/dL — ABNORMAL HIGH (ref 6–20)
CHLORIDE: 112 mmol/L — AB (ref 101–111)
CO2: 22 mmol/L (ref 22–32)
Calcium: 8 mg/dL — ABNORMAL LOW (ref 8.9–10.3)
Creatinine, Ser: 1.3 mg/dL — ABNORMAL HIGH (ref 0.61–1.24)
GFR, EST AFRICAN AMERICAN: 59 mL/min — AB (ref >60)
GFR, EST NON AFRICAN AMERICAN: 51 mL/min — AB (ref >60)
Glucose, Bld: 129 mg/dL — ABNORMAL HIGH (ref 65–99)
POTASSIUM: 3.8 mmol/L (ref 3.5–5.1)
SODIUM: 139 mmol/L (ref 135–145)
Total Bilirubin: 0.5 mg/dL (ref 0.3–1.2)
Total Protein: 5 g/dL — ABNORMAL LOW (ref 6.5–8.1)

## 2016-11-16 LAB — TRIGLYCERIDES: TRIGLYCERIDES: 76 mg/dL (ref <150)

## 2016-11-16 NOTE — Progress Notes (Signed)
STROKE TEAM PROGRESS NOTE   SUBJECTIVE (INTERVAL HISTORY) Wife, daughter and son are at bedside. Pt still intubated on sedation. Neuro decline with extension bilaterally to pain stimulation. CT repeat yesterday showed stable hematoma, IVH, hydrocephalus and midline shift. EVD in place at 10 mmHg.   Had long discussion with family and wife regarding potential prognosis and treatment options. They would like to continue observe for several days before making decision.    OBJECTIVE Temp:  [98.9 F (37.2 C)-100.5 F (38.1 C)] 98.9 F (37.2 C) (01/03 1200) Pulse Rate:  [56-90] 70 (01/03 1400) Cardiac Rhythm: Normal sinus rhythm (01/03 0800) Resp:  [16-26] 17 (01/03 1400) BP: (110-186)/(41-110) 132/57 (01/03 1400) SpO2:  [96 %-100 %] 100 % (01/03 1400) FiO2 (%):  [40 %] 40 % (01/03 1059) Weight:  [168 lb 3.4 oz (76.3 kg)] 168 lb 3.4 oz (76.3 kg) (01/03 0600)  CBC:  Recent Labs Lab 08-Dec-2016 1158  11/15/16 0429 11/16/16 0443  WBC 8.9  < > 12.1* 11.8*  NEUTROABS 7.2  --   --   --   HGB 14.7  < > 12.4* 11.1*  HCT 41.6  < > 36.1* 33.9*  MCV 86.8  < > 87.6 90.9  PLT 163  < > 165 138*  < > = values in this interval not displayed.  Basic Metabolic Panel:   Recent Labs Lab 11/15/16 0429 11/15/16 1710 11/16/16 0443  NA 137  --  139  K 3.6  --  3.8  CL 107  --  112*  CO2 21*  --  22  GLUCOSE 142*  --  129*  BUN 33*  --  39*  CREATININE 1.42*  --  1.30*  CALCIUM 8.2*  --  8.0*  MG 2.2 2.1  --   PHOS 2.7 2.8  --     Lipid Panel:     Component Value Date/Time   CHOL 166 11/14/2016 0844   TRIG 77 11/14/2016 0844   HDL 45 11/14/2016 0844   CHOLHDL 3.7 11/14/2016 0844   VLDL 15 11/14/2016 0844   LDLCALC 106 (H) 11/14/2016 0844   HgbA1c:  Lab Results  Component Value Date   HGBA1C 5.0 11/14/2016   Urine Drug Screen:     Component Value Date/Time   LABOPIA NONE DETECTED 11/14/2016 1121   COCAINSCRNUR NONE DETECTED 11/14/2016 1121   LABBENZ NONE DETECTED 11/14/2016  1121   AMPHETMU NONE DETECTED 11/14/2016 1121   THCU NONE DETECTED 11/14/2016 1121   LABBARB NONE DETECTED 11/14/2016 1121      IMAGING I have personally reviewed the radiological images below and agree with the radiology interpretations.   Ct Head Code Stroke W/o Cm December 08, 2016 1. Acute right deep gray matter hemorrhage with estimated parenchymal blood volume of 32 mL. Regional mass effect with leftward midline shift of 8 mm. 2. Extension of hemorrhage into the ventricles with moderate volume IVH and acute ventriculomegaly suspected. 3. ASPECTS is not applicable, acute intracranial hemorrhage.   11/15/2016 IMPRESSION: 1. Similar size and appearance of evolving right thalamic/basal ganglia hematoma with localized mass effect and edema. 8 mm of right-to-left midline shift is not significantly changed. 2. Intraventricular extension with moderate to severe hydrocephalus, also relatively similar. Left frontal approach ventricular catheter in place. Small amount of hemorrhage along the catheter tract is unchanged.   11/15/2016 IMPRESSION: New small volume hemorrhage along LEFT ventriculostomy catheter tract. Similar moderate to severe hydrocephalus and intraventricular hemorrhage. Evolving large RIGHT thalamic/basal ganglia hematoma. Similar 9 mm RIGHT to LEFT midline  shift.   Dg Chest Port 1 View 11/15/2016 IMPRESSION: 1. No acute cardiopulmonary disease. 2. Endotracheal tube well positioned, tip 3 cm above the carina. No other change from the prior study.    TTE Left ventricle: The cavity size was normal. Wall thickness was   normal. Systolic function was normal. The estimated ejection   fraction was in the range of 55% to 60%. Wall motion was normal;   there were no regional wall motion abnormalities. Doppler   parameters are consistent with abnormal left ventricular   relaxation (grade 1 diastolic dysfunction). - Aortic valve: There was no stenosis. - Mitral valve: There was no significant  regurgitation. - Left atrium: The atrium was mildly dilated. - Right ventricle: The cavity size was mildly dilated. Systolic   function was normal. - Right atrium: The atrium was mildly dilated. - Pulmonary arteries: No complete TR doppler jet so unable to   estimate PA systolic pressure.  EEG This sedated EEG is abnormal due to diffuse slowing of the waking background. Clinical Correlation of the above findings indicates diffuse cerebral dysfunction that is non-specific in etiology and can be seen with hypoxic/ischemic injury, toxic/metabolic encephalopathies, neurodegenerative disorders, or medication effect.  Clinical correlation is advised.   PHYSICAL EXAM  Temp:  [98.9 F (37.2 C)-100.5 F (38.1 C)] 98.9 F (37.2 C) (01/03 1200) Pulse Rate:  [56-90] 70 (01/03 1400) Resp:  [16-26] 17 (01/03 1400) BP: (110-186)/(41-110) 132/57 (01/03 1400) SpO2:  [96 %-100 %] 100 % (01/03 1400) FiO2 (%):  [40 %] 40 % (01/03 1059) Weight:  [168 lb 3.4 oz (76.3 kg)] 168 lb 3.4 oz (76.3 kg) (01/03 0600)  General - Well nourished, well developed, intubated on sedation.  Ophthalmologic - Fundi not visualized due to noncooperation.  Cardiovascular - Regular rate and rhythm.  Neuro - intubated and on sedation, eyes closed and not open on voice or pain. Not following commands. Bilateral pupil small 1.67mm, but PERRL, eyes mid position, doll's eye sluggish, not blinking to visual threat bilaterally, corneal weak L>R, positive gag and cough. Facial symmetry not able to tested. On pain stimulation, RUE extention, RLE with distal DF. LUE and LLE hemiplegia. RUE increased muscle tone. DTR 1+ and no babinski. Sensation, coordination and gait not tested.    ASSESSMENT/PLAN Mr. Jake Jacobs is a 76 y.o. male with history of being "sick with severe cough" the past few days who developed acute altered mental status and L hemiplegia. CT showed R thalamic hemorrhage with IVH.   Stroke:  Non-dominant right  thalamic hemorrhage with IVH and evolving hydrocephalus. 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  Neurosurgery Conchita Paris) placed EVD, currently at 20 cmH2O  Repeat CT head 11/14/16 stable ICH with IVH and hydrocephalus  Repeat CT head 11/15/16 stable ICH with IVH and hydrocephalus and stable midline shift  2D Echo  EF 55-60%   EEG diffuse slowing, no seizure  LDL 106   HgbA1c 5.0  Heparin subq for VTE prophylaxis Diet NPO time specified  aspirin 81 mg daily prior to admission, now not on antithrombotics due to ICH  Ongoing aggressive stroke risk factor management  Therapy recommendations:  pending   Disposition:  Pending  Obstructive hydrocephalus  NSG on board  S/p EVD  Repeat CT stable hydrocephalus  Cerebral edema  Mild midline shift on CT head  Repeat CT showed stable midline shift, no significant change from  Hold off hypertonic saline  for now  Acute Respiratory Failure  Secondary to ICH  Intubated in the ED  CCM on board  Hypertensive Emergency  BP elevated on admission, as high as 209/115  off cardene drip now  Put on lisinopril, labetalol and amlodipine for BP control  Hyperlipidemia  LDL 106, goal <70   Hold off statin for now due to ICH and IVH  Consider statin on discharge  Other Stroke Risk Factors  Advanced age  Hospital day # 3  This patient is critically ill due to right BG ICH with IVH and hydrocephalus and at significant risk of neurological worsening, death form cerebral edema, brain herniation, hydrocephalus, hypertensive emergency. This patient's care requires constant monitoring of vital signs, hemodynamics, respiratory and cardiac monitoring, review of multiple databases, neurological assessment, discussion with family, other specialists and medical decision making of high complexity. I spent 45 minutes of neurocritical care time  in the care of this patient.  I had long discussion with wife and other family members along with Dr. Conchita ParisNundkumar. We discussed about potential poor prognosis, treatment options. Answered all their questions. They would like to continue observation for the next several days before making decision.   Marvel PlanJindong Bevan Vu, MD PhD Stroke Neurology 11/16/2016 2:37 PM   To contact Stroke Continuity provider, please refer to [email or web address]. After hours, contact General Neurology

## 2016-11-16 NOTE — Progress Notes (Signed)
OT Cancellation Note  Patient Details Name: Jake Jacobs MRN: [id number] DOB: 07/06/1941   Cancelled Treatment:    Reason Eval/Treat Not Completed: Patient not medically ready (bedrest )  Felecia ShellingJones, [name]   [name], [name]   OTR/L Pager: [phone] Office: [phone] .  11/16/2016, 8:05 AM

## 2016-11-16 NOTE — Progress Notes (Signed)
PT Cancellation Note  Patient Details Name: Jake Jacobs MRN: [id number] DOB: 01/18/1941   Cancelled Treatment:    Reason Eval/Treat Not Completed: Patient not medically ready Pt on bedrest, intubated and sedated. Will await appropriateness for therapy. Will follow up.   Blake DivineShauna A Jalyne Brodzinski 11/16/2016, 8:50 AM Mylo RedShauna Naziah Weckerly, PT, DPT [phone]

## 2016-11-16 NOTE — Progress Notes (Signed)
PULMONARY / CRITICAL CARE MEDICINE   Name:Jake Jacobs MRN: [id number] DOB: 04/01/1941    ADMISSION DATE:  05-01-16 CONSULTATION DATE:  12/312  REFERRING MD:  Denton Lanksteinl   CHIEF COMPLAINT:  ICH  SUBJECTIVE:  Remains on propofol gtt on vent. No events overnight.   VITAL SIGNS: BP (!) 121/56   Pulse 65   Temp 98.9 F (37.2 C) (Axillary)   Resp 17   Ht 5\' 11"  (1.803 m)   Wt 76.3 kg (168 lb 3.4 oz)   SpO2 99%   BMI 23.46 kg/m   HEMODYNAMICS:    VENTILATOR SETTINGS: Vent Mode: PRVC FiO2 (%):  [40 %] 40 % Set Rate:  [16 bmp] 16 bmp Vt Set:  [600 mL] 600 mL PEEP:  [5 cmH20] 5 cmH20 Plateau Pressure:  [12 cmH20-19 cmH20] 18 cmH20  INTAKE / OUTPUT: I/O last 3 completed shifts: In: 5348 [I.V.:3405.5; NG/GT:1792.4; IV Piggyback:150] Out: 1232 [Urine:925; Drains:307]  PHYSICAL EXAMINATION: General:  Adult male, no distress, on vent  Neuro:  Sedated, posturing BLE, Bilateral 2 mm pupils - sluggish  HEENT:  NCAT. Orally intubated. ICV in place  Cardiovascular:  RRR, no MRG, NI S1/S2 Lungs:  Clear breath sounds bilaterally, on vent  Abdomen:  Soft, not tender + bowel sounds  Musculoskeletal:  Equal bulk  Skin:  Warm and dry  LABS:  BMET  Recent Labs Lab 11/14/16 0552 11/15/16 0429 11/16/16 0443  NA 138 137 139  K 3.6 3.6 3.8  CL 106 107 112*  CO2 23 21* 22  BUN 14 33* 39*  CREATININE 1.17 1.42* 1.30*  GLUCOSE 103* 142* 129*    Electrolytes  Recent Labs Lab 11/14/16 0552  11/14/16 1701 11/15/16 0429 11/15/16 1710 11/16/16 0443  CALCIUM 8.2*  --   --  8.2*  --  8.0*  MG 2.2  < > 2.1 2.2 2.1  --   PHOS 3.6  < > 2.1* 2.7 2.8  --   < > = values in this interval not displayed.  CBC  Recent Labs Lab 11/14/16 0552 11/15/16 0429 11/16/16 0443  WBC 9.8 12.1* 11.8*  HGB 12.2* 12.4* 11.1*  HCT 35.9* 36.1* 33.9*  PLT 163 165 138*    Coag's  Recent Labs Lab January 24, 2016 1158  APTT 34  INR 1.07    Sepsis Markers No results for input(s):  LATICACIDVEN, PROCALCITON, O2SATVEN in the last 168 hours.  ABG  Recent Labs Lab January 24, 2016 1355 11/14/16 0450  PHART 7.370 7.385  PCO2ART 44.3 43.9  PO2ART 459.0* 118*    Liver Enzymes  Recent Labs Lab January 24, 2016 1158 11/16/16 0443  AST 20 23  ALT 11* 15*  ALKPHOS 59 39  BILITOT 1.0 0.5  ALBUMIN 3.5 2.5*    Cardiac Enzymes No results for input(s): TROPONINI, PROBNP in the last 168 hours.  Glucose  Recent Labs Lab 11/15/16 1228 11/15/16 1536 11/15/16 1944 11/15/16 2331 11/16/16 0349 11/16/16 0806  GLUCAP 131* 162* 144* 139* 133* 133*    Imaging Ct Head Wo Contrast  Result Date: 11/15/2016 CLINICAL DATA:  Follow-up intracranial hemorrhage. EXAM: CT HEAD WITHOUT CONTRAST TECHNIQUE: Contiguous axial images were obtained from the base of the skull through the vertex without intravenous contrast. COMPARISON:  Prior CT from earlier the same day. FINDINGS: Brain: Acute intraparenchymal hemorrhage centered at the right basal ganglia again seen, overall similar in size measuring 4.0 x 3.6 x 4.3 cm in greatest dimensions. Similar surrounding low-density vasogenic edema and mass effect. Intraventricular extension again seen with overall similar volume  of intraventricular blood. 8 mm of right-to-left midline shift is relatively stable. Left frontal approach ventricular catheter remains in place with tip in the body of the left lateral ventricle. Small amount of blood again noted along the ventricular tract. Moderate to severe hydrocephalus is relatively similar. No new intracranial hemorrhage. No evidence for acute large vessel territory infarct. Chronic microvascular ischemic changes involving the supratentorial white matter again noted. Low-density hygroma versus chronic subdural at the right posterior fossa again noted, stable. Vascular: Scattered vascular calcifications present within the carotid siphons. No hyperdense vessel. Skull: Soft tissue swelling at the left frontal scalp. No  skull fracture. Sinuses/Orbits: Globes and orbital soft tissues within normal limits. Scattered mucosal thickening within the ethmoidal air cells. Paranasal sinuses are otherwise clear. Trace opacity right mastoid air cells. IMPRESSION: 1. Similar size and appearance of evolving right thalamic/basal ganglia hematoma with localized mass effect and edema. 8 mm of right-to-left midline shift is not significantly changed. 2. Intraventricular extension with moderate to severe hydrocephalus, also relatively similar. Left frontal approach ventricular catheter in place. Small amount of hemorrhage along the catheter tract is unchanged. Electronically Signed   By: Rise Mu M.D.   On: 11/15/2016 13:33   Dg Chest Port 1 View  Result Date: 11/15/2016 CLINICAL DATA:  Acute intracranial hemorrhage.  Intubated patient. EXAM: PORTABLE CHEST 1 VIEW COMPARISON:  11/06/2016 FINDINGS: Endotracheal tube tip now projects 3 cm above the carina, well positioned. Nasal/ orogastric tube passes below the diaphragm into the stomach. There are reticular type opacities at the lung bases likely combination scarring and subsegmental atelectasis. There are few small scattered fairly dense nodules consistent with healed granuloma, stable. Lungs are hyperexpanded but otherwise clear. No pleural effusion or pneumothorax. Cardiac silhouette is normal in size. No mediastinal or hilar masses. Left hilar calcified nodes in AP window nodes are stable. IMPRESSION: 1. No acute cardiopulmonary disease. 2. Endotracheal tube well positioned, tip 3 cm above the carina. No other change from the prior study. Electronically Signed   By: Amie Portland M.D.   On: 11/15/2016 09:47     STUDIES:  CT head 12/31: 1. Acute right deep gray matter hemorrhage w Regional mass effect with leftward midline shift of 8 mm.2. Extension of hemorrhage into the ventricles with moderate volume  IVH and acute ventriculomegaly suspected. CT head 1/1: 1. Status post  EVD placement with no adverse features. Ventricular size has not significantly changed, and might therefore be at baseline for this patient. The volume of intraventricular hemorrhage has minimally increased since yesterday. 2. Similar slight increase in the volume of intra-axial hemorrhage centered at the right deep gray matter nuclei. Estimated blood volume now 39 mL (versus 32 mL previously). 3. Regional mass effect and leftward midline shift of 8-9 mm have not significantly changed. 4. No new intracranial abnormality. CT head 1/2: IMPRESSION:New small volume hemorrhage along LEFT ventriculostomy catheter tract. Similar moderate to severe hydrocephalus and intraventricular hemorrhage.Evolving large RIGHT thalamic/basal ganglia hematoma. Similar 9 mm RIGHT to LEFT midline shift. 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 Blood perhaps a little worse. Clinically worse w/ both radiographic and clinically. Now posturing and no longer purposeful on right.   -Patient Partial Code > only okay with vent support  -cont IVC management per Neuro-surg -gentle hydration (cret worse) - rx fever - follow cultures -not ready for weaning r/t  mental status   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  ->neuro exam a little worse. Now w/ posturing on right P:   RASS goal: -1 Wean Propofol as tolerated  SBP < 160 IVC per N-surg  PULMONARY A: Ventilatory dependence in setting of inability to protect airway  P:   Full vent support -Mental Status barrier to extubation  Intermittent CXR PAD protocol   CARDIOVASCULAR A:  HTN  P:  Tele SBP goal < 160  RENAL A:   Mild AKI P:   Trend bmp Gentle IVFs  Strict I&O  GASTROINTESTINAL A:   Nausea/vomiting (d/t increased ICP?) P:    NPO Start tubefeeds  HEMATOLOGIC A:   No acute  P:  scds in setting of ICH Trend CBC Transfuse per ICU protocol   INFECTIOUS A:   Fever, mild increase in leukocytosis  P:   Trend CBC & fever curve Culture blood and CSF  ENDOCRINE A:   Mild hyperglycemia  P:   ssi  Glucose checks q4h    FAMILY  - Updates: Spoke with wife and son on 1/3, would like to wait until 1/7 before making decision to withdrawal care. Wife and son agree that patient would not want tracheostomy or to live in nursing home. Wife agrees that if patient heart stops that no drugs or manuel compressions be preformed.   - Inter-disciplinary family meet or Palliative Care meeting due by:  1/6  Jovita Kussmaul, AG-ACNP [facility]  Pgr: [phone]  PCCM Pgr: [phone]   ATTENDING NOTE / ATTESTATION NOTE :   I have discussed the case with the resident/APP  Jovita Kussmaul NP.   I agree with the resident/APP's  history, physical examination, assessment, and plans.    I have edited the above note and modified it according to our agreed history, physical examination, assessment and plan.   Briefly, pt admitted with a large R thalamic/BG ICH with IVH, with 8 mm R to L midline shift,  likely HTNsive bleed. S/P Ventricular drain. Intubated for airway protection. CT scan of head 1/2 with new hemorrhage per ventriculostomy site and rpt cranial ct scan on 1/2 was stable. Was dysynchronous with the vent on 1/2 so Propofol drip was started. Continues to have RUE decerebrate posturing.   Pt seen. Sedated. Intubated. Comfortable. BP 130/60, HR 70. (-) NVD. Good ae. CTA. good s1/s2. (-) m. (+) BS, soft, NT. (-) edema. Limited neuro exam as pt is sedated. Posturing noted in RUE.   Labs reviewed.  Assessment and Plan: Acute R BG/Thalamic ICH/IVH with midline shift. S/P ventriculostomy drain 12/31.  Prognosis is guarded. Currently with RUE decerebrate posturing.  Neurology and  Neurosurgery seeing pt.  Cranial CT scan is stable on 1/2. Code status was discussed and wife made pt DNR.  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   Acute hypoxemic resp failure 2/2 unable to protect airway.  Cont vent support. Not ready for wean.   I have spent 30 minutes of critical care time with this patient today.  Family :Family updated at length today.  Case discussed with wife.    Pollie Meyer, MD 11/16/2016, 12:48 PM [facility] Pager [phone] After 3 pm or if no answer, call [phone]

## 2016-11-16 NOTE — Progress Notes (Signed)
Visited with family in pt's rm. The other stepdaughter had arrived from [place] on [place]. The wife and the 2 stepdaughters I'd met that day in ED when pt came in were also there. The former said she was taking the time she needed, and was able to stay here with the rest of her family. Provided emotional/spiritual support and prayer. Chaplain available for f/u.    11/16/16 1400  Clinical Encounter Type  Visited With Patient and family together  Visit Type Follow-up;Psychological support;Spiritual support;Social support;Critical Care  Referral From Chaplain  Spiritual Encounters  Spiritual Needs Prayer;Emotional  Stress Factors  Patient Stress Factors Health changes;Loss of control  Family Stress Factors Family relationships;Health changes;Loss of control   Cynthia A Drew, Chaplain 

## 2016-11-16 NOTE — Progress Notes (Signed)
No issues overnight.   EXAM:  BP (!) 150/75   Pulse 82   Temp (!) 101.6 F (38.7 C) (Axillary)   Resp (!) 21   Ht 5\' 11"  (1.803 m)   Wt 76.3 kg (168 lb 3.4 oz)   SpO2 98%   BMI 23.46 kg/m   On low-dose propofol: No eye opening Pupils reactive (+) cough/gag Extensor posturing RUE, no movement on left EVD draining well at 10mmHg  IMPRESSION:  76 y.o. male with hypertensive right thalamic hemorrhage with IVH. He appears to have had some decline over the last day or two, now posturing.  PLAN: - Cont drainage at 10mm.  Dr. Roda ShuttersXu and I had a lengthy discussion with the patient's family this am regarding possible outcomes. Given his decline, I think his prognosis is likely poor. In the next few days we will likely have to decide upon a course of action, i.e. Long-term care (trach/PEG) vs palliative.

## 2016-11-16 NOTE — Progress Notes (Signed)
PHARMACY - PHYSICIAN COMMUNICATION CRITICAL VALUE ALERT - BLOOD CULTURE IDENTIFICATION (BCID)  Results for orders placed or performed during the hospital encounter of 10/24/2016  Blood Culture ID Panel (Reflexed) (Collected: 11/15/2016  5:10 PM)  Result Value Ref Range   Enterococcus species NOT DETECTED NOT DETECTED   Listeria monocytogenes NOT DETECTED NOT DETECTED   Staphylococcus species DETECTED (A) NOT DETECTED   Staphylococcus aureus NOT DETECTED NOT DETECTED   Methicillin resistance NOT DETECTED NOT DETECTED   Streptococcus species NOT DETECTED NOT DETECTED   Streptococcus agalactiae NOT DETECTED NOT DETECTED   Streptococcus pneumoniae NOT DETECTED NOT DETECTED   Streptococcus pyogenes NOT DETECTED NOT DETECTED   Acinetobacter baumannii NOT DETECTED NOT DETECTED   Enterobacteriaceae species NOT DETECTED NOT DETECTED   Enterobacter cloacae complex NOT DETECTED NOT DETECTED   Escherichia coli NOT DETECTED NOT DETECTED   Klebsiella oxytoca NOT DETECTED NOT DETECTED   Klebsiella pneumoniae NOT DETECTED NOT DETECTED   Proteus species NOT DETECTED NOT DETECTED   Serratia marcescens NOT DETECTED NOT DETECTED   Haemophilus influenzae NOT DETECTED NOT DETECTED   Neisseria meningitidis NOT DETECTED NOT DETECTED   Pseudomonas aeruginosa NOT DETECTED NOT DETECTED   Candida albicans NOT DETECTED NOT DETECTED   Candida glabrata NOT DETECTED NOT DETECTED   Candida krusei NOT DETECTED NOT DETECTED   Candida parapsilosis NOT DETECTED NOT DETECTED   Candida tropicalis NOT DETECTED NOT DETECTED    Name of physician (or Provider) Contacted: Dr. Christene Slatese Dios  Changes to prescribed antibiotics required: Gram stain showed GPC in clusters (1/2) and BCID resulted in staph species. Currently not on antibiotics. No antibiotics recommended at this time.   Carylon PerchesMaggie Tyrrell Stephens, PharmD Acute Care Pharmacy Resident  Pager: [phone] 11/16/2016

## 2016-11-17 ENCOUNTER — Inpatient Hospital Stay (HOSPITAL_COMMUNITY): Payer: Medicare Other

## 2016-11-17 DIAGNOSIS — E785 Hyperlipidemia, unspecified: Secondary | ICD-10-CM | POA: Diagnosis not present

## 2016-11-17 DIAGNOSIS — I610 Nontraumatic intracerebral hemorrhage in hemisphere, subcortical: Secondary | ICD-10-CM | POA: Diagnosis not present

## 2016-11-17 DIAGNOSIS — J9601 Acute respiratory failure with hypoxia: Secondary | ICD-10-CM | POA: Diagnosis not present

## 2016-11-17 DIAGNOSIS — G919 Hydrocephalus, unspecified: Secondary | ICD-10-CM | POA: Diagnosis not present

## 2016-11-17 DIAGNOSIS — I615 Nontraumatic intracerebral hemorrhage, intraventricular: Secondary | ICD-10-CM | POA: Diagnosis not present

## 2016-11-17 DIAGNOSIS — I10 Essential (primary) hypertension: Secondary | ICD-10-CM | POA: Diagnosis not present

## 2016-11-17 LAB — CBC
HCT: 33.9 % — ABNORMAL LOW (ref 39.0–52.0)
Hemoglobin: 11 g/dL — ABNORMAL LOW (ref 13.0–17.0)
MCH: 28.9 pg (ref 26.0–34.0)
MCHC: 32.4 g/dL (ref 30.0–36.0)
MCV: 89.2 fL (ref 78.0–100.0)
Platelets: 159 10*3/uL (ref 150–400)
RBC: 3.8 MIL/uL — AB (ref 4.22–5.81)
RDW: 14.4 % (ref 11.5–15.5)
WBC: 12.8 10*3/uL — ABNORMAL HIGH (ref 4.0–10.5)

## 2016-11-17 LAB — GLUCOSE, CAPILLARY
GLUCOSE-CAPILLARY: 104 mg/dL — AB (ref 65–99)
GLUCOSE-CAPILLARY: 107 mg/dL — AB (ref 65–99)
GLUCOSE-CAPILLARY: 123 mg/dL — AB (ref 65–99)
Glucose-Capillary: 111 mg/dL — ABNORMAL HIGH (ref 65–99)
Glucose-Capillary: 129 mg/dL — ABNORMAL HIGH (ref 65–99)
Glucose-Capillary: 118 mg/dL — ABNORMAL HIGH (ref 65–99)

## 2016-11-17 LAB — BASIC METABOLIC PANEL
ANION GAP: 9 (ref 5–15)
Anion gap: 6 (ref 5–15)
BUN: 42 mg/dL — ABNORMAL HIGH (ref 6–20)
BUN: 42 mg/dL — ABNORMAL HIGH (ref 6–20)
CALCIUM: 8 mg/dL — AB (ref 8.9–10.3)
CO2: 22 mmol/L (ref 22–32)
CO2: 24 mmol/L (ref 22–32)
Calcium: 7.8 mg/dL — ABNORMAL LOW (ref 8.9–10.3)
Chloride: 111 mmol/L (ref 101–111)
Chloride: 107 mmol/L (ref 101–111)
Creatinine, Ser: 1.24 mg/dL (ref 0.61–1.24)
Creatinine, Ser: 1.37 mg/dL — ABNORMAL HIGH (ref 0.61–1.24)
GFR calc Af Amer: >60 mL/min (ref >60)
GFR calc non Af Amer: 54 mL/min — ABNORMAL LOW (ref >60)
GFR, EST AFRICAN AMERICAN: 56 mL/min — AB (ref >60)
GFR, EST NON AFRICAN AMERICAN: 48 mL/min — AB (ref >60)
GLUCOSE: 125 mg/dL — AB (ref 65–99)
Glucose, Bld: 140 mg/dL — ABNORMAL HIGH (ref 65–99)
POTASSIUM: 4.1 mmol/L (ref 3.5–5.1)
Potassium: 3.8 mmol/L (ref 3.5–5.1)
Sodium: 139 mmol/L (ref 135–145)
Sodium: 140 mmol/L (ref 135–145)

## 2016-11-17 LAB — PHOSPHORUS: PHOSPHORUS: 2.8 mg/dL (ref 2.5–4.6)

## 2016-11-17 LAB — MAGNESIUM: MAGNESIUM: 2.3 mg/dL (ref 1.7–2.4)

## 2016-11-17 MED ORDER — ALBUTEROL SULFATE (2.5 MG/3ML) 0.083% IN NEBU: 2.5000 mg | INHALATION_SOLUTION | RESPIRATORY_TRACT | Status: DC | PRN

## 2016-11-17 MED ORDER — FUROSEMIDE 10 MG/ML IJ SOLN
40.0000 mg | Freq: Two times a day (BID) | INTRAMUSCULAR | Status: AC
  Administered 2016-11-17 (×2): 40 mg via INTRAVENOUS
  Filled 2016-11-17 (×2): qty 4

## 2016-11-17 MED ORDER — IPRATROPIUM-ALBUTEROL 0.5-2.5 (3) MG/3ML IN SOLN
3.0000 mL | Freq: Four times a day (QID) | RESPIRATORY_TRACT | Status: DC
  Administered 2016-11-17 – 2016-11-18 (×6): 3 mL via RESPIRATORY_TRACT
  Filled 2016-11-17 (×6): qty 3

## 2016-11-17 MED ORDER — IPRATROPIUM-ALBUTEROL 0.5-2.5 (3) MG/3ML IN SOLN
3.0000 mL | Freq: Four times a day (QID) | RESPIRATORY_TRACT | Status: DC | PRN
  Administered 2016-11-17: 3 mL via RESPIRATORY_TRACT
  Filled 2016-11-17: qty 3

## 2016-11-17 NOTE — Progress Notes (Signed)
No issues overnight. No changes.  EXAM:  BP (!) 164/80   Pulse 80   Temp (!) 100.5 F (38.1 C) (Axillary)   Resp (!) 28   Ht 5\' 11"  (1.803 m)   Wt 77.4 kg (170 lb 10.2 oz)   SpO2 98%   BMI 23.80 kg/m   No eye opening Extensor posturing RUE No movement LUE/LLE EVD in place, draining well  IMPRESSION:  76 y.o. male with right thalamic ICH with IVH. EVD in place, functional. No neurologic change.  PLAN: - Cont supportive care for now.

## 2016-11-17 NOTE — Care Management Important Message (Signed)
Important Message  Patient Details  Name: Jake Jacobs MRN: [id number] Date of Birth: 10/04/1941   Medicare Important Message Given:  Yes    Estellar Cadena 11/17/2016, 1:28 PM

## 2016-11-17 NOTE — Progress Notes (Signed)
Palliative Medicine consult noted. Due to high referral volume, there may be a delay seeing this patient. Please call the Palliative Medicine Team office at [phone] if recommendations are needed in the interim.  Thank you for inviting us to see this patient.  Margret ChanceMelanie G. Byanka Landrus, RN, BSN, [facility] 11/17/2016 3:07 PM Cell [phone] 8:00-4:00 Monday-Friday Office [phone]

## 2016-11-17 NOTE — Progress Notes (Signed)
OT Cancellation Note  Patient Details Name: Jake Jacobs MRN: [id number] DOB: 04/06/1941   Cancelled Treatment:    Reason Eval/Treat Not Completed: Other (comment). OT signing off at this time. If evaluation needed, please reorder. Thanks  [facility]  Nicolet Griffy, OT/L  [phone] 11/17/2016 11/17/2016, 4:04 PM

## 2016-11-17 NOTE — Progress Notes (Addendum)
PT Cancellation Note  Patient Details Name: Jake Jacobs MRN: [id number] DOB: 04/20/1941   Cancelled Treatment:    Reason Eval/Treat Not Completed: Patient not medically ready Continues to be on bedrest. Sedated and intubated. Will follow up as appropriate.  Pt worsening. Per RN will sign off for now as pt not appropriate for therapy. Please re consult if pt becomes appropriate.  Blake DivineShauna A Cammie Faulstich 11/17/2016, 8:48 AM Mylo RedShauna Cressida Milford, PT, DPT [phone]

## 2016-11-17 NOTE — Progress Notes (Signed)
PULMONARY / CRITICAL CARE MEDICINE   Name:Jake Jacobs MRN: [id number] DOB: 05/31/1941    ADMISSION DATE:  11/09/2016 CONSULTATION DATE:  12/312  REFERRING MD:  Denton Lanksteinl   CHIEF COMPLAINT:  ICH  SUBJECTIVE:  No acute change overnight.   VITAL SIGNS: BP (!) 120/42   Pulse 78   Temp 99.4 F (37.4 C) (Axillary)   Resp (!) 29   Ht 5\' 11"  (1.803 m)   Wt 77.4 kg (170 lb 10.2 oz)   SpO2 100%   BMI 23.80 kg/m   HEMODYNAMICS:    VENTILATOR SETTINGS: Vent Mode: PRVC FiO2 (%):  [40 %] 40 % Set Rate:  [16 bmp] 16 bmp Vt Set:  [600 mL] 600 mL PEEP:  [5 cmH20] 5 cmH20 Plateau Pressure:  [18 cmH20-37 cmH20] 37 cmH20  INTAKE / OUTPUT: I/O last 3 completed shifts: In: 5349.2 [I.V.:3009.2; NG/GT:2340] Out: 1179 [Urine:850; Drains:329]  PHYSICAL EXAMINATION: General:  Adult male, no distress, on vent  Neuro:  Sedated, posturing BLE, Bilateral 2 mm pupils - sluggish  HEENT:  NCAT. Orally intubated. ICV in place  Cardiovascular:  RRR, no MRG, NI S1/S2 Lungs:  Clear breath sounds bilaterally, on vent  Abdomen:  Soft, not tender + bowel sounds  Musculoskeletal:  Equal bulk  Skin:  Warm and dry  LABS:  BMET  Recent Labs Lab 11/15/16 0429 11/16/16 0443 11/17/16 0449  NA 137 139 139  K 3.6 3.8 3.8  CL 107 112* 111  CO2 21* 22 22  BUN 33* 39* 42*  CREATININE 1.42* 1.30* 1.24  GLUCOSE 142* 129* 140*    Electrolytes  Recent Labs Lab 11/15/16 0429 11/15/16 1710 11/16/16 0443 11/17/16 0449  CALCIUM 8.2*  --  8.0* 8.0*  MG 2.2 2.1  --  2.3  PHOS 2.7 2.8  --  2.8    CBC  Recent Labs Lab 11/15/16 0429 11/16/16 0443 11/17/16 0449  WBC 12.1* 11.8* 12.8*  HGB 12.4* 11.1* 11.0*  HCT 36.1* 33.9* 33.9*  PLT 165 138* 159    Coag's  Recent Labs Lab 10/19/2016 1158  APTT 34  INR 1.07    Sepsis Markers No results for input(s): LATICACIDVEN, PROCALCITON, O2SATVEN in the last 168 hours.  ABG  Recent Labs Lab 11/04/2016 1355 11/14/16 0450  PHART 7.370  7.385  PCO2ART 44.3 43.9  PO2ART 459.0* 118*    Liver Enzymes  Recent Labs Lab 10/21/2016 1158 11/16/16 0443  AST 20 23  ALT 11* 15*  ALKPHOS 59 39  BILITOT 1.0 0.5  ALBUMIN 3.5 2.5*    Cardiac Enzymes No results for input(s): TROPONINI, PROBNP in the last 168 hours.  Glucose  Recent Labs Lab 11/16/16 1144 11/16/16 1527 11/16/16 1941 11/16/16 2322 11/17/16 0338 11/17/16 0809  GLUCAP 127* 101* 134* 136* 111* 123*    Imaging Dg Chest Port 1 View  Result Date: 11/17/2016 CLINICAL DATA:  Intubated patient for acute respiratory failure. Acute intra ventricular hemorrhage. EXAM: PORTABLE CHEST 1 VIEW COMPARISON:  Portable chest x-ray of November 15, 2016 FINDINGS: The lungs remain well-expanded. The interstitial markings are more prominent overall today There is increased density at the left lung base consistent with progressive atelectasis or pneumonia. A small left pleural effusion is present. The heart and pulmonary vascularity are normal. The endotracheal tube tip projects 5.9 cm above the carina. The esophagogastric tube tip projects below the inferior margin of the image. There are calcifications in the AP window. IMPRESSION: Worsening appearance of the pulmonary interstitium bilaterally which may reflect interstitial edema  or interstitial pneumonia. Worsening of left basilar atelectasis or pneumonia. Electronically Signed   By: David  [place] M.D.   On: 11/17/2016 07:26     STUDIES:  CT head 12/31: 1. Acute right deep gray matter hemorrhage w Regional mass effect with leftward midline shift of 8 mm.2. Extension of hemorrhage into the ventricles with moderate volume  IVH and acute ventriculomegaly suspected. CT head 1/1: 1. Status post EVD placement with no adverse features. Ventricular size has not significantly changed, and might therefore be at baseline for this patient. The volume of intraventricular hemorrhage has minimally increased since yesterday. 2. Similar slight  increase in the volume of intra-axial hemorrhage centered at the right deep gray matter nuclei. Estimated blood volume now 39 mL (versus 32 mL previously). 3. Regional mass effect and leftward midline shift of 8-9 mm have not significantly changed. 4. No new intracranial abnormality. CT head 1/2: IMPRESSION:New small volume hemorrhage along LEFT ventriculostomy catheter tract. Similar moderate to severe hydrocephalus and intraventricular hemorrhage.Evolving large RIGHT thalamic/basal ganglia hematoma. Similar 9 mm RIGHT to LEFT midline shift. Echo 1/2 > EF 55-60%, G1DD  CULTURES: Blood 1/2>>>  1/2 coag neg staph (suspect contaminant)  CSF 1/2>>>cancelled   ANTIBIOTICS:   SIGNIFICANT EVENTS:   LINES/TUBES: OETT 12/31>>> IVC 12/31>>>  DISCUSSION: Large R thalamic/BG ICH with IVH, with 8 mm R to L midline shift, likely HTNsive bleed. S/P Ventricular drain. Intubated for airway protection. CT scan of head 1/2 with new hemorrhage per ventriculostomy site and rpt cranial ct scan on 1/2 was stable. Clinical decline 1/3.   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  P:   RASS goal: -1 Wean Propofol as tolerated  SBP < 160 Per neurosurgery, neuro IVC per nsgy   PULMONARY A: Ventilatory dependence in setting of inability to protect airway Volume overload  P:   Full vent support -Mental Status barrier to extubation  Daily SBT  Per wife pt would NOT want track/PEG/prolonged care but not ready to make decisions for withdrawal Intermittent CXR PAD protocol  Lasix 40mg  IV BID x 2 doses   CARDIOVASCULAR A:  HTN  P:  Tele SBP goal < 160  RENAL A:   Mild AKI P:   F/u chem in am  Gentle IVFs  Strict I&O Lasix as above   GASTROINTESTINAL A:   Nausea/vomiting (d/t increased ICP?) P:   NPO Cont TF   HEMATOLOGIC A:   No acute  P:  scds in setting of ICH Trend CBC Transfuse per ICU protocol   INFECTIOUS A:    Fever, mild increase in leukocytosis  P:   Trend CBC & fever curve Follow blood culture   ENDOCRINE A:   Mild hyperglycemia  P:   ssi     FAMILY  - Updates: Family updated at bedside 1/4.  Per previous discussions - wife and son agree that patient would not want tracheostomy or to live in nursing home. Wife agrees that if patient heart stops that no drugs or manuel compressions be preformed.   - Inter-disciplinary family meet or Palliative Care meeting due by:  1/6     Dirk Dress, NP 11/17/2016  9:30 AM Pager: [phone] or [phone]      ATTENDING NOTE / ATTESTATION NOTE :   I have discussed the case with the resident/APP  Dirk Dress Np  I agree with the resident/APP's  history, physical examination, assessment, and plans.    I  have edited the above note and modified it according to our agreed history, physical examination, assessment and plan.   Briefly, pt admitted with a large R thalamic/BG ICH with IVH, with 8 mm R to L midline shift, likely HTNsive bleed. S/P Ventricular drain. Intubated for airway protection. CT scan of head 1/2 with new hemorrhage per ventriculostomy site and rpt cranial ct scan on 1/2 was stable. Was dysynchronous with the vent on 1/2 so Propofol drip was started. Continues to have RUE decerebrate posturing.   Pt seen. Sedated. Intubated. Comfortable. BP 160/80, HR 80. (-) NVD. Good ae. ctrackles at bases. good s1/s2. (-) m. (+) BS, soft, NT. (+) edema. Limited neuro exam as pt is sedated. Posturing noted in RUE.   Labs reviewed.  Assessment and Plan: Acute R BG/Thalamic ICH/IVH with midline shift. S/P ventriculostomy drain 12/31. Prognosis is guarded. Currently with RUE decerebrate posturing.  Neurology and Neurosurgery seeing pt. Cranial CT scan is stable on 1/2. Code status was discussed and wife made pt DNR.  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  Palliative  Care team has been consulted.   Acute hypoxemic resp failure 2/2 unable to protect airway and pulm edema.Cont vent support. Not ready for wean.  Needs diuresis. Will d/c IVF.   I have spent 30 minutes of critical care time with this patient today.  Family :Family updated at length today.  Spoke to wife on 1/4  J. Alexis Frock, MD 11/17/2016, 12:31 PM [facility] Pager [phone] After 3 pm or if no answer, call [phone]

## 2016-11-17 NOTE — Progress Notes (Signed)
STROKE TEAM PROGRESS NOTE   SUBJECTIVE (INTERVAL HISTORY) Wife and daughter are at bedside. Pt still intubated on sedation. Neuro unchanged with continued extension bilaterally to pain stimulation without sedation. CT repeat today no significant change from prior. EVD in place at 10 mmHg with continued drainage.   Had long discussion with family and wife regarding potential prognosis and treatment options. They would like to continue observe until Sunday and agree with palliative care consultation.    OBJECTIVE Temp:  [99.4 F (37.4 C)-101.6 F (38.7 C)] 100.5 F (38.1 C) (01/04 1200) Pulse Rate:  [48-82] 80 (01/04 1200) Cardiac Rhythm: Normal sinus rhythm (01/03 2000) Resp:  [16-32] 28 (01/04 1200) BP: (107-169)/(42-81) 164/80 (01/04 1200) SpO2:  [97 %-100 %] 98 % (01/04 1305) FiO2 (%):  [40 %] 40 % (01/04 1119) Weight:  [170 lb 10.2 oz (77.4 kg)] 170 lb 10.2 oz (77.4 kg) (01/04 0600)  CBC:  Recent Labs Lab 10/21/2016 1158  11/16/16 0443 11/17/16 0449  WBC 8.9  < > 11.8* 12.8*  NEUTROABS 7.2  --   --   --   HGB 14.7  < > 11.1* 11.0*  HCT 41.6  < > 33.9* 33.9*  MCV 86.8  < > 90.9 89.2  PLT 163  < > 138* 159  < > = values in this interval not displayed.  Basic Metabolic Panel:   Recent Labs Lab 11/15/16 1710  11/17/16 0449 11/17/16 1414  NA  --   < > 139 140  K  --   < > 3.8 4.1  CL  --   < > 111 107  CO2  --   < > 22 24  GLUCOSE  --   < > 140* 125*  BUN  --   < > 42* 42*  CREATININE  --   < > 1.24 1.37*  CALCIUM  --   < > 8.0* 7.8*  MG 2.1  --  2.3  --   PHOS 2.8  --  2.8  --   < > = values in this interval not displayed.  Lipid Panel:     Component Value Date/Time   CHOL 166 11/14/2016 0844   TRIG 76 11/16/2016 1518   HDL 45 11/14/2016 0844   CHOLHDL 3.7 11/14/2016 0844   VLDL 15 11/14/2016 0844   LDLCALC 106 (H) 11/14/2016 0844   HgbA1c:  Lab Results  Component Value Date   HGBA1C 5.0 11/14/2016   Urine Drug Screen:     Component Value Date/Time    LABOPIA NONE DETECTED 11/14/2016 1121   COCAINSCRNUR NONE DETECTED 11/14/2016 1121   LABBENZ NONE DETECTED 11/14/2016 1121   AMPHETMU NONE DETECTED 11/14/2016 1121   THCU NONE DETECTED 11/14/2016 1121   LABBARB NONE DETECTED 11/14/2016 1121      IMAGING I have personally reviewed the radiological images below and agree with the radiology interpretations.   Ct Head Code Stroke W/o Cm 11/11/2016 1. Acute right deep gray matter hemorrhage with estimated parenchymal blood volume of 32 mL. Regional mass effect with leftward midline shift of 8 mm. 2. Extension of hemorrhage into the ventricles with moderate volume IVH and acute ventriculomegaly suspected. 3. ASPECTS is not applicable, acute intracranial hemorrhage.   11/15/2016 IMPRESSION: 1. Similar size and appearance of evolving right thalamic/basal ganglia hematoma with localized mass effect and edema. 8 mm of right-to-left midline shift is not significantly changed. 2. Intraventricular extension with moderate to severe hydrocephalus, also relatively similar. Left frontal approach ventricular catheter in place. Small  amount of hemorrhage along the catheter tract is unchanged.   11/15/2016 IMPRESSION: New small volume hemorrhage along LEFT ventriculostomy catheter tract. Similar moderate to severe hydrocephalus and intraventricular hemorrhage. Evolving large RIGHT thalamic/basal ganglia hematoma. Similar 9 mm RIGHT to LEFT midline shift.   Dg Chest Port 1 View 11/15/2016 IMPRESSION: 1. No acute cardiopulmonary disease. 2. Endotracheal tube well positioned, tip 3 cm above the carina. No other change from the prior study.    TTE Left ventricle: The cavity size was normal. Wall thickness was   normal. Systolic function was normal. The estimated ejection   fraction was in the range of 55% to 60%. Wall motion was normal;   there were no regional wall motion abnormalities. Doppler   parameters are consistent with abnormal left ventricular    relaxation (grade 1 diastolic dysfunction). - Aortic valve: There was no stenosis. - Mitral valve: There was no significant regurgitation. - Left atrium: The atrium was mildly dilated. - Right ventricle: The cavity size was mildly dilated. Systolic   function was normal. - Right atrium: The atrium was mildly dilated. - Pulmonary arteries: No complete TR doppler jet so unable to   estimate PA systolic pressure.  EEG This sedated EEG is abnormal due to diffuse slowing of the waking background. Clinical Correlation of the above findings indicates diffuse cerebral dysfunction that is non-specific in etiology and can be seen with hypoxic/ischemic injury, toxic/metabolic encephalopathies, neurodegenerative disorders, or medication effect.  Clinical correlation is advised.   PHYSICAL EXAM  Temp:  [99.4 F (37.4 C)-101.6 F (38.7 C)] 100.5 F (38.1 C) (01/04 1200) Pulse Rate:  [48-82] 80 (01/04 1200) Resp:  [16-32] 28 (01/04 1200) BP: (107-169)/(42-81) 164/80 (01/04 1200) SpO2:  [97 %-100 %] 98 % (01/04 1305) FiO2 (%):  [40 %] 40 % (01/04 1119) Weight:  [170 lb 10.2 oz (77.4 kg)] 170 lb 10.2 oz (77.4 kg) (01/04 0600)  General - Well nourished, well developed, intubated off sedation.  Ophthalmologic - Fundi not visualized due to noncooperation.  Cardiovascular - Regular rate and rhythm.  Neuro - intubated and off sedation, eyes closed and not open on voice or pain. Not following commands. Bilateral pupil small 2mm, brisk pupil reflex, eyes mid position, doll's eye positive, not blinking to visual threat bilaterally, corneal reflex present, positive gag and cough. Facial symmetry not able to tested. On pain stimulation, BUE extention, BLE mild withdraw with distal DF. Increased muscle tone in all extremities. DTR 1+ and no babinski. Sensation, coordination and gait not tested.    ASSESSMENT/PLAN Mr. Jake Jacobs is a 76 y.o. male with history of being "sick with severe cough" the past  few days who developed acute altered mental status and L hemiplegia. CT showed R thalamic hemorrhage with IVH.   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  Neurosurgery Conchita Paris) placed EVD, currently at 20 cmH2O  Repeat CT head 11/14/16 stable ICH with IVH and hydrocephalus  Repeat CT head 11/15/16 stable ICH with IVH and hydrocephalus and stable midline shift  Repeat CT head 11/17/16 stable, no significant change  2D Echo  EF 55-60%   EEG diffuse slowing, no seizure  LDL 106   HgbA1c 5.0  Heparin subq for VTE prophylaxis Diet NPO time specified  aspirin 81 mg daily prior to admission, now not on antithrombotics due to ICH  Ongoing aggressive stroke risk  factor management  Therapy recommendations:  pending   Disposition:  Pending  Obstructive hydrocephalus  NSG on board  S/p EVD  Repeat CT stable hydrocephalus  Cerebral edema  Mild midline shift on CT head  Repeat CT showed stable midline shift, no significant change from prior  Hold off hypertonic saline for now  Acute Respiratory Failure  Secondary to ICH  Intubated in the ED  CCM on board  Hypertensive Emergency  BP elevated on admission, as high as 209/115  off cardene drip now  Put on lisinopril, labetalol and amlodipine for BP control  BP stable  Hyperlipidemia  LDL 106, goal <70   Hold off statin for now due to ICH and IVH  Consider statin on discharge  Other Stroke Risk Factors  Advanced age  Hospital day # 4  This patient is critically ill due to right BG ICH with IVH and hydrocephalus and at significant risk of neurological worsening, death form cerebral edema, brain herniation, hydrocephalus, hypertensive emergency. This patient's care requires constant monitoring of vital signs, hemodynamics,  respiratory and cardiac monitoring, review of multiple databases, neurological assessment, discussion with family, other specialists and medical decision making of high complexity. I spent 45 minutes of neurocritical care time in the care of this patient.  Had long discussion with daughter and wife regarding potential prognosis and treatment options. They would like to continue observe until Sunday and agree with palliative care consultation.   Marvel Plan, MD PhD Stroke Neurology 11/17/2016 3:41 PM   To contact Stroke Continuity provider, please refer to [email or web address]. After hours, contact General Neurology

## 2016-11-17 NOTE — Progress Notes (Signed)
Stopped by to visit w/ family in light of their charted discussion w/ med staff as to wishing to wait a few more days to make any decision to withdraw care (though decision was made for only partial code, in this case = no escalation of care). Pt's son is reportedly expected from [place] on Sunday. Nurse said family had just withdrawn to waiting rm while nurses bathed pt. Sought family there to no avail. Will f/u tomorrow.   11/17/16 1600  Clinical Encounter Type  Visited With Patient not available  Visit Type Follow-up  Referral From Chaplain   Ephraim Hamburgerynthia A Malcome Ambrocio, Chaplain

## 2016-11-17 NOTE — Progress Notes (Signed)
  Trasnsported pt to and from 3M07 to CT3 on ventilator. Pt stable throughout with no complications. RT will continue to monitor.

## 2016-11-17 NOTE — Care Management Note (Signed)
Case Management Note  Patient Details  Name: Jake Jacobs MRN: [id number] Date of Birth: 12/07/1940  Subjective/Objective:   Pt admitted on 10/18/2016 s/p IVH with hydrocephalus and midline shift.  PTA, pt independent, lives with wife.                   Action/Plan: Pt with intubated with poor prognosis; family would prefer to observe pt for a few days before making a decision about possible comfort care.  Will continue to follow.     Expected Discharge Date:                  Expected Discharge Plan:     In-House Referral:  Clinical Social Work, Software engineerChaplain  Discharge planning Services  CM Consult  Post Acute Care Choice:    Choice offered to:     DME Arranged:    DME Agency:     HH Arranged:    HH Agency:     Status of Service:  In process, will continue to follow  If discussed at Long Length of Stay Meetings, dates discussed:    Additional Comments:  Glennon Macmerson, Cordia Miklos M, RN 11/17/2016, 10:21 AM

## 2016-11-18 ENCOUNTER — Inpatient Hospital Stay (HOSPITAL_COMMUNITY): Payer: Medicare Other

## 2016-11-18 DIAGNOSIS — G911 Obstructive hydrocephalus: Secondary | ICD-10-CM

## 2016-11-18 DIAGNOSIS — I615 Nontraumatic intracerebral hemorrhage, intraventricular: Secondary | ICD-10-CM | POA: Diagnosis not present

## 2016-11-18 DIAGNOSIS — Z9911 Dependence on respirator [ventilator] status: Secondary | ICD-10-CM

## 2016-11-18 DIAGNOSIS — J9601 Acute respiratory failure with hypoxia: Secondary | ICD-10-CM | POA: Diagnosis not present

## 2016-11-18 DIAGNOSIS — I610 Nontraumatic intracerebral hemorrhage in hemisphere, subcortical: Secondary | ICD-10-CM | POA: Diagnosis not present

## 2016-11-18 DIAGNOSIS — Z7189 Other specified counseling: Secondary | ICD-10-CM

## 2016-11-18 DIAGNOSIS — Z978 Presence of other specified devices: Secondary | ICD-10-CM

## 2016-11-18 DIAGNOSIS — Z515 Encounter for palliative care: Secondary | ICD-10-CM | POA: Diagnosis not present

## 2016-11-18 LAB — BASIC METABOLIC PANEL
ANION GAP: 8 (ref 5–15)
BUN: 44 mg/dL — ABNORMAL HIGH (ref 6–20)
CHLORIDE: 108 mmol/L (ref 101–111)
CO2: 26 mmol/L (ref 22–32)
Calcium: 7.9 mg/dL — ABNORMAL LOW (ref 8.9–10.3)
Creatinine, Ser: 1.48 mg/dL — ABNORMAL HIGH (ref 0.61–1.24)
GFR calc non Af Amer: 44 mL/min — ABNORMAL LOW (ref >60)
GFR, EST AFRICAN AMERICAN: 51 mL/min — AB (ref >60)
Glucose, Bld: 139 mg/dL — ABNORMAL HIGH (ref 65–99)
POTASSIUM: 3.5 mmol/L (ref 3.5–5.1)
Sodium: 142 mmol/L (ref 135–145)

## 2016-11-18 LAB — GLUCOSE, CAPILLARY
GLUCOSE-CAPILLARY: 129 mg/dL — AB (ref 65–99)
GLUCOSE-CAPILLARY: 111 mg/dL — AB (ref 65–99)
Glucose-Capillary: 97 mg/dL (ref 65–99)
Glucose-Capillary: 146 mg/dL — ABNORMAL HIGH (ref 65–99)
Glucose-Capillary: 134 mg/dL — ABNORMAL HIGH (ref 65–99)

## 2016-11-18 LAB — CULTURE, BLOOD (ROUTINE X 2)

## 2016-11-18 LAB — MAGNESIUM: Magnesium: 2.2 mg/dL (ref 1.7–2.4)

## 2016-11-18 LAB — CBC
HCT: 33.7 % — ABNORMAL LOW (ref 39.0–52.0)
HEMOGLOBIN: 11 g/dL — AB (ref 13.0–17.0)
MCH: 29.3 pg (ref 26.0–34.0)
MCHC: 32.6 g/dL (ref 30.0–36.0)
MCV: 89.9 fL (ref 78.0–100.0)
Platelets: 153 10*3/uL (ref 150–400)
RBC: 3.75 MIL/uL — AB (ref 4.22–5.81)
RDW: 14.2 % (ref 11.5–15.5)
WBC: 11.4 10*3/uL — ABNORMAL HIGH (ref 4.0–10.5)

## 2016-11-18 LAB — PHOSPHORUS: Phosphorus: 3.9 mg/dL (ref 2.5–4.6)

## 2016-11-18 MED ORDER — VITAL AF 1.2 CAL PO LIQD
1000.0000 mL | ORAL | Status: DC
  Administered 2016-11-19: 1000 mL
  Filled 2016-11-18: qty 1000

## 2016-11-18 MED ORDER — POTASSIUM CHLORIDE 20 MEQ/15ML (10%) PO SOLN
20.0000 meq | ORAL | Status: AC
  Administered 2016-11-18: 20 meq
  Filled 2016-11-18: qty 15

## 2016-11-18 MED ORDER — PRO-STAT SUGAR FREE PO LIQD
30.0000 mL | Freq: Two times a day (BID) | ORAL | Status: DC
  Administered 2016-11-18 – 2016-11-19 (×3): 30 mL
  Filled 2016-11-18 (×3): qty 30

## 2016-11-18 NOTE — Progress Notes (Signed)
Encountered wife and stepdaughter from [place] returning to rm. Daughter immediately said her mother was feeling pressured to decide, but wife demurred, saying she wasn't ready to decide anything yet.. In rm daughter said she was flying back to [place] today, would be back Sunday or Monday. (Her sisters Selena BattenKim and Arline AspCindy are here.)  While wife was standing bedside w/ pt, tepdaughtter said that pt's only child, son Christen BameRonnie (who lives elsewhere in [place]), has been here twice, was really shaken up by the situation -- and that her mom and Christen BameRonnie needed to get together. Left copies of a Bible and a New Testament/Psalms for the family in rm to use/share, which stepdaughter appreciated, Chaplain available for f/u.   11/18/16 1100  Clinical Encounter Type  Visited With Patient and family together  Visit Type Follow-up;Psychological support;Spiritual support;Social support;Critical Care  Referral From Chaplain  Spiritual Encounters  Spiritual Needs [facility];Emotional  Stress Factors  Patient Stress Factors Health changes;Loss of control  Family Stress Factors Family relationships;Health changes;Major life changes   Ephraim Hamburgerynthia A Liviah Cake, [address] Hospital Roadhaplain

## 2016-11-18 NOTE — Consult Note (Signed)
Consultation Note Date: 11/18/2016   Patient Name: Jake Jacobs  DOB: Apr 07, 1941  MRN: [id number]  Age / Sex: 76 y.o., male  PCP: [facility] Referring Physician: Collene Gobble, MD  Reason for Consultation: Establishing goals of care  HPI/Patient Profile: 76 y.o. male  with no known past medical history and no ongoing primary medical care (per spouse no medical problems) admitted on 11/12/2016 after being found unresponsive and with left hemiplegia at his computer at home. Workup revealed large R thalamic IPH with ventricular extension requiring placement of EVD for HCP, there is a regional mass effect with a leftward midline shift of 51m. He was unable to maintain his airway and was intubated in the ED. He is currently in neuro ICU with EVD drain in place, intubated, receiving tube feed through NG tube, with R ICH and obtructive hydrocephalus. Now clinically worsening with most recent CT showing new hemorrhage on left with severe HCP and IVH, and not posturing. Palliative medicine consulted for [place]   Clinical Assessment and Goals of Care: Reviewed patient's chart and completed clinical evaluation. Patient appeared to be sedated and comfortable.  Met with patient's wife, daughter and brother-in-law at the bedside. They have had several conversations with multiple providers and chaplain about patient's current status and prognosis. They declined any further discussion with Palliative medicine team until patient's son has arrived to town. Family will request RN to contact palliative service when son arrives so that family meeting time can be arranged.   NEXT OF KIN - spouse    SUMMARY OF RECOMMENDATIONS -Continue current level of care -Family considering extubation and comfort measures but request to delay further palliative discussion until son arrives from out of town -Family will request  palliative medicine to be contacted when son arrives ("in a few day) so that full family meeting time can be arranged   Code Status/Advance Care Planning:  Limited code - no chest compressions, no ACLS meds, no defibrillation   Palliative Prophylaxis:   Frequent Pain Assessment  Prognosis:   Unable to determine - very limited due to severe ICH with midline shift, family considering extubation and transition to comfort care, but would like to delay further discussion with palliative team until son arrives from out of town  Discharge Planning: To Be Determined      Primary Diagnoses: Present on Admission: . Intraventricular hemorrhage ([place]   I have reviewed the medical record, interviewed the patient and family, and examined the patient. The following aspects are pertinent.  Past Medical History:  Diagnosis Date  . Former smoker    not smoked in about 20 years per wife   Social History   Social History  . Marital status: Unknown    Spouse name: N/A  . Number of children: N/A  . Years of education: N/A   Social History Main Topics  . Smoking status: Former SResearch scientist (life sciences) . Smokeless tobacco: Never Used  . Alcohol use 0.6 oz/week    1 Glasses of wine per week  Comment: once every 2/3 months  . Drug use: No  . Sexual activity: Not Asked   Other Topics Concern  . None   Social History Narrative  . None   History reviewed. No pertinent family history. Scheduled Meds: .  stroke: mapping our early stages of recovery book   Does not apply Once  . amLODipine  10 mg Oral Daily  . chlorhexidine gluconate (MEDLINE KIT)  15 mL Mouth Rinse BID  . feeding supplement (PRO-STAT SUGAR FREE 64)  30 mL Per Tube BID  . heparin subcutaneous  5,000 Units Subcutaneous Q8H  . insulin aspart  0-9 Units Subcutaneous Q4H  . ipratropium-albuterol  3 mL Nebulization Q6H  . labetalol  200 mg Per Tube TID  . mouth rinse  15 mL Mouth Rinse 10 times per day  . pantoprazole sodium  40 mg Per  Tube Q1200  . senna-docusate  1 tablet Oral BID   Continuous Infusions: . feeding supplement (VITAL AF 1.2 CAL)    . niCARDipine Stopped (11/15/16 0500)  . propofol (DIPRIVAN) infusion 50 mcg/kg/min (11/18/16 1000)   PRN Meds:.acetaminophen **OR** acetaminophen (TYLENOL) oral liquid 160 mg/5 mL **OR** acetaminophen, albuterol, fentaNYL (SUBLIMAZE) injection, fentaNYL (SUBLIMAZE) injection, labetalol Medications Prior to Admission:  Prior to Admission medications   Medication Sig Start Date End Date Taking? Authorizing Provider  aspirin EC 81 MG tablet Take 81 mg by mouth daily.   Yes Historical Provider, MD  Chlorphen-Phenyleph-APAP (CORICIDIN D COLD/FLU/SINUS) 2-5-325 MG TABS Take 5 mLs by mouth daily as needed (cough).    Yes Historical Provider, MD  guaifenesin (ROBITUSSIN) 100 MG/5ML syrup Take 200 mg by mouth 3 (three) times daily as needed for cough.   Yes Historical Provider, MD  OMEGA-3 FATTY ACIDS PO Take 1 capsule by mouth daily.   Yes Historical Provider, MD   No Known Allergies Review of Systems  Unable to perform ROS: Intubated    Physical Exam  Cardiovascular: Normal rate and regular rhythm.   Pulmonary/Chest: He has wheezes.  intubated  Neurological:  nonresponsive  Nursing note and vitals reviewed.   Vital Signs: BP (!) 135/55   Pulse 72   Temp 99.8 F (37.7 C) (Axillary)   Resp 18   Ht _0  (1.803 m)   Wt 78.1 kg (172 lb 2.9 oz)   SpO2 98%   BMI 24.01 kg/m  Pain Assessment: CPOT       SpO2: SpO2: 98 % O2 Device:SpO2: 98 % O2 Flow Rate: .   IO: Intake/output summary:   Intake/Output Summary (Last 24 hours) at 11/18/16 1520 Last data filed at 11/18/16 1200  Gross per 24 hour  Intake           2096.1 ml  Output             1931 ml  Net            165.1 ml    LBM: Last BM Date: 11/17/16 Baseline Weight: Weight: 74.8 kg (165 lb) Most recent weight: Weight: 78.1 kg (172 lb 2.9 oz)     Palliative Assessment/Data: PPS: 10%   Flowsheet Rows    Flowsheet Row Most Recent Value  Intake Tab  Referral Department  Neurology  Unit at Time of Referral  ICU  Palliative Care Primary Diagnosis  Neurology  Date Notified  11/17/16  Palliative Care Type  New Palliative care  Reason for referral  Clarify Goals of Care  Clinical Assessment  Palliative Performance Scale Score  10%  Psychosocial &  Spiritual Assessment  Palliative Care Outcomes  Patient/Family meeting held?  No  Palliative Care follow-up planned  Yes, Facility       Time Total: 50 minutes  Greater than 50%  of this time was spent counseling and coordinating care related to the above assessment and plan.  Signed by: Payton Emerald, NP   Please contact Palliative Medicine Team phone at [phone] for questions and concerns.  For individual provider: See Shea Evans

## 2016-11-18 NOTE — Progress Notes (Signed)
No issues overnight.   EXAM:  BP 139/62   Pulse 69   Temp 98.2 F (36.8 C) (Axillary)   Resp 18   Ht 5\' 11"  (1.803 m)   Wt 78.1 kg (172 lb 2.9 oz)   SpO2 99%   BMI 24.01 kg/m   No eye opening Pupils reactive Breathes over vent Extensor posturing RUE, no movement LUE/LLE  IMPRESSION:  76 y.o. male with large right thalamic hypertensive hemorrhage with IVH, EVD in place and functional. Has had slow decline in neurologic condition since admission. Prognosis remains grim.  PLAN: - cont current supportive care, family likely to make decision about withdrawal of care v. Long-term care (trach/PEG) on Sunday

## 2016-11-18 NOTE — Progress Notes (Signed)
[facility] FOR [facility] LAB REPLACEMENT ONLY  The patient does [facility] the [facility] Protocol based on the criteria listed below:   1. Is GFR >/= 40 ml/min? Yes.    Patient's GFR today is >60 2. Is urine output >/= 0.5 ml/kg/hr for the last 6 hours? Yes.   Patient's UOP is 2.0 ml/kg/hr 3. Is BUN < 60 mg/dL? Yes.    Patient's BUN today is 44 4. Abnormal electrolyte(s): Potassium 3.5 5. Ordered repletion with: Potassium per protocol 6. If a panic level lab has been reported, has the CCM MD in charge been notified? No..   Physician:    Thomasenia BottomsLANTZY, Reannah Totten P 11/18/2016 4:28 AM

## 2016-11-18 NOTE — Progress Notes (Signed)
Encountered pt's 2 step-daughters [place] [facility] in waiting area. Discussed how their sister Dennie Bibleat had already left to return to [place] for wkend, but yesterday before she left they were grateful that she got their mom to her hotel rm to sleep and shower for a few hrs -- after which she naturally felt better. They felt it was a help to her perspective as well.   Have been encouraging them since pt came in last Sunday to ensure their mom eats, and while they say they are all getting tired of hospital food and it took her mom a while to start eating, she has eaten well since she began (they especially like cafeteria soups). But now Dennie Bibleat is gone (though she returns early next wk), they say their mom will not leave her husband to go sleep anywhere else but in his rm -- she wants to spend as much time w/ him as she can.   They do not believe their mom is feeling pressured to decide anything, thought they understand the doctors are beginning to talk about options. They believe when pt's son Christen B[place]Ronnie comes from [place] this weekend, he will also understand the situation better. They were complimentary about the care pt is receiving from all his doctors and nurses, and especially appreciated how nice Drs. Conchita ParisNundkumar and Roda ShuttersXu are in talking w/ them. Chaplain available for f/u.   11/18/16 1600  Clinical Encounter Type  Visited With Family  Visit Type Follow-up;Psychological support;Spiritual support;Social support;Critical Care  Referral From Chaplain  Spiritual Encounters  Spiritual Needs Emotional  Stress Factors  Patient Stress Factors Health changes;Loss of control  Family Stress Factors Family relationships;Health changes;Loss of control   Ephraim Hamburgerynthia A Eleana Tocco, [address]

## 2016-11-18 NOTE — Progress Notes (Signed)
PULMONARY / CRITICAL CARE MEDICINE   Name:Jake Jacobs MRN: [id number] DOB: [birth year]    ADMISSION DATE:  12-03-2016 CONSULTATION DATE:  12/312  REFERRING MD:  Denton Lank   CHIEF COMPLAINT:  ICH  SUBJECTIVE:  No acute change overnight. Remains on propofol gtt.   VITAL SIGNS: BP (!) 167/67   Pulse 82   Temp 98.2 F (36.8 C) (Axillary)   Resp (!) 25   Ht 5\' 11"  (1.803 m)   Wt 78.1 kg (172 lb 2.9 oz)   SpO2 100%   BMI 24.01 kg/m   HEMODYNAMICS:    VENTILATOR SETTINGS: Vent Mode: PRVC FiO2 (%):  [40 %] 40 % Set Rate:  [16 bmp] 16 bmp Vt Set:  [600 mL] 600 mL PEEP:  [5 cmH20] 5 cmH20 Plateau Pressure:  [17 cmH20-24 cmH20] 18 cmH20  INTAKE / OUTPUT: I/O last 3 completed shifts: In: 3709.7 [I.V.:1369.7; NG/GT:2340] Out: 3037 [Urine:2750; Drains:287]  PHYSICAL EXAMINATION: General:  Adult male, no distress, on vent  Neuro:  Sedated, posturing BUE/BLE HEENT:  NCAT. Orally intubated. ICV in place  Cardiovascular:  RRR, no MRG, NI S1/S2 Lungs:  Clear breath sounds bilaterally, on vent  Abdomen:  Soft, not tender + bowel sounds  Musculoskeletal:  Equal bulk  Skin:  Warm and dry, intact   LABS:  BMET  Recent Labs Lab 11/17/16 0449 11/17/16 1414 11/18/16 0303  NA 139 140 142  K 3.8 4.1 3.5  CL 111 107 108  CO2 22 24 26   BUN 42* 42* 44*  CREATININE 1.24 1.37* 1.48*  GLUCOSE 140* 125* 139*    Electrolytes  Recent Labs Lab 11/15/16 1710  11/17/16 0449 11/17/16 1414 11/18/16 0303  CALCIUM  --   < > 8.0* 7.8* 7.9*  MG 2.1  --  2.3  --  2.2  PHOS 2.8  --  2.8  --  3.9  < > = values in this interval not displayed.  CBC  Recent Labs Lab 11/16/16 0443 11/17/16 0449 11/18/16 0303  WBC 11.8* 12.8* 11.4*  HGB 11.1* 11.0* 11.0*  HCT 33.9* 33.9* 33.7*  PLT 138* 159 153    Coag's  Recent Labs Lab 03-Dec-2016 1158  APTT 34  INR 1.07    Sepsis Markers No results for input(s): LATICACIDVEN, PROCALCITON, O2SATVEN in the last 168  hours.  ABG  Recent Labs Lab December 03, 2016 1355 11/14/16 0450  PHART 7.370 7.385  PCO2ART 44.3 43.9  PO2ART 459.0* 118*    Liver Enzymes  Recent Labs Lab 12-03-16 1158 11/16/16 0443  AST 20 23  ALT 11* 15*  ALKPHOS 59 39  BILITOT 1.0 0.5  ALBUMIN 3.5 2.5*    Cardiac Enzymes No results for input(s): TROPONINI, PROBNP in the last 168 hours.  Glucose  Recent Labs Lab 11/17/16 1218 11/17/16 1634 11/17/16 1945 11/17/16 2343 11/18/16 0400 11/18/16 0802  GLUCAP 129* 107* 118* 104* 97 129*    Imaging Ct Head Wo Contrast  Result Date: 11/17/2016 CLINICAL DATA:  76 year old male with acute intracranial hemorrhage presenting on December 03, 2016. Status post EVD. Initial encounter. EXAM: CT HEAD WITHOUT CONTRAST TECHNIQUE: Contiguous axial images were obtained from the base of the skull through the vertex without intravenous contrast. COMPARISON:  11/15/2016 head CTs and earlier. FINDINGS: Brain: Left superior frontal approach EVD appears stable, terminating along the inferior aspect of the left frontal horn. The moderate volume of intraventricular hemorrhage has slightly regressed since 11/15/2016, most notably in the fourth ventricle. Ventricle size is stable since 11/14/2016. Intra-axial hemorrhage centered at the right  deep gray matter nuclei encompasses 43 by 38 x 32 mm foreign estimated blood volume of 26 mL which is mildly regressed since 11/14/2016. Regional mass effect and edema are stable. Leftward midline shift of 9 mm has not significantly changed. Gray and white-matter density elsewhere in the brain is stable. 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 Vascular: Calcified atherosclerosis at the skull base. Skull: Stable left superior frontal burr hole for EVD. No acute osseous abnormality identified. Sinuses/Orbits: stable mostly ethmoid and sphenoid sinus opacification. Some bubbly opacity. Chronic mastoid sclerosis. Superimposed mild mastoid effusions now all,  acute on chronic on the right. Other: Sequelae of left superior approach EVD. No acute orbit or scalp soft tissue findings. IMPRESSION: 1. Stable brain since 11/15/2016.  No new intracranial abnormality. 2. Volume of Intra-axial hemorrhage at the right deep gray matter nuclei has slightly regressed since 11/14/2016. Intra-axial blood volume now estimated at 26 mL. Stable surrounding edema and mass effect. 3. Stable ventriculomegaly and left frontal approach EVD. Intraventricular volume stable to slightly decreased since 11/15/2016. Electronically Signed   By: Odessa FlemingH  Hall M.D.   On: 11/17/2016 13:46   Dg Chest Portable 1 View  Result Date: 11/18/2016 CLINICAL DATA:  Respiratory failure.  Intubated.  CVA. EXAM: PORTABLE CHEST 1 VIEW COMPARISON:  Chest radiograph from one day prior. FINDINGS: Endotracheal tube tip is 4.2 cm above the carina. Enteric tube enters stomach with the tip not seen on this image. Stable cardiomediastinal silhouette with normal heart size. No pneumothorax. No pleural effusion. No pulmonary edema. Mild bibasilar atelectasis appears stable. No new consolidative airspace disease. Stable granulomatous calcifications overlying the left hilum. IMPRESSION: 1. Well-positioned support structures. 2. Stable mild bibasilar atelectasis. Electronically Signed   By: Delbert PhenixJason A Poff M.D.   On: 11/18/2016 07:50     STUDIES:  CT head 12/31: 1. Acute right deep gray matter hemorrhage w Regional mass effect with leftward midline shift of 8 mm.2. Extension of hemorrhage into the ventricles with moderate volume  IVH and acute ventriculomegaly suspected. CT head 1/1: 1. Status post EVD placement with no adverse features. Ventricular size has not significantly changed, and might therefore be at baseline for this patient. The volume of intraventricular hemorrhage has minimally increased since yesterday. 2. Similar slight increase in the volume of intra-axial hemorrhage centered at the right deep gray matter  nuclei. Estimated blood volume now 39 mL (versus 32 mL previously). 3. Regional mass effect and leftward midline shift of 8-9 mm have not significantly changed. 4. No new intracranial abnormality. CT head 1/2: IMPRESSION:New small volume hemorrhage along LEFT ventriculostomy catheter tract. Similar moderate to severe hydrocephalus and intraventricular hemorrhage.Evolving large RIGHT thalamic/basal ganglia hematoma. Similar 9 mm RIGHT to LEFT midline shift. Echo 1/2 > EF 55-60%, G1DD  CULTURES: Blood 1/2>>>  1/2 coag neg staph (suspect contaminant)  CSF 1/2>>>cancelled   ANTIBIOTICS:  LINES/TUBES: OETT 12/31>>> IVC 12/31>>>  DISCUSSION: Large R thalamic/BG ICH with IVH, with 8 mm R to L midline shift, likely HTNsive bleed. S/P Ventricular drain. Intubated for airway protection. CT scan of head 1/2 with new hemorrhage per ventriculostomy site and rpt cranial ct scan on 1/2 was stable. Clinical decline 1/3.   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  P:   RASS goal: -1 Wean Propofol as tolerated  SBP < 160 Per neurosurgery, neuro IVC per nsgy   PULMONARY A: Ventilatory dependence in setting of inability to protect airway Volume overload  P:  Full vent support -Mental Status barrier to extubation  Daily SBT  Per wife pt would NOT want track/PEG/prolonged care but not ready to make decisions for withdrawal Intermittent CXR PAD protocol  Lasix 40mg  IV BID x 2 doses   CARDIOVASCULAR A:  HTN  P:  Tele SBP goal < 160  RENAL A:   Mild AKI P:   F/u chem in am  Gentle IVFs  Strict I&O Lasix as above   GASTROINTESTINAL A:   Nausea/vomiting (d/t increased ICP?) P:   NPO Cont TF   HEMATOLOGIC A:   No acute  P:  scds in setting of ICH Trend CBC Transfuse per ICU protocol   INFECTIOUS A:   Fever, mild increase in leukocytosis  P:   Trend CBC & fever curve  ENDOCRINE A:   Mild hyperglycemia  P:   ssi      FAMILY  - Updates: Family updated at bedside 1/5.  Per previous discussions - wife and son agree that patient would not want tracheostomy or to live in nursing home. Wife agrees that if patient heart stops that no drugs or manuel compressions be preformed.   - Inter-disciplinary family meet or Palliative Care meeting due by:  1/8 (Plan to have meeting Monday morning to discuss withdrawal of care)    Jovita Kussmaul, AG-ACNP [facility]  Pgr: [phone]  PCCM Pgr: [phone]      ATTENDING NOTE / ATTESTATION NOTE :   I have discussed the case with the resident/APP  Jovita Kussmaul NP.   I agree with the resident/APP's  history, physical examination, assessment, and plans.    I have edited the above note and modified it according to our agreed history, physical examination, assessment and plan.   Briefly, pt admitted with a large R thalamic/BG ICH with IVH, with 8 mm R to L midline shift, likely HTNsive bleed. S/P Ventricular drain. Intubated for airway protection. CT scan of head 1/2 with new hemorrhage per ventriculostomy site and rpt cranial ct scan on 1/2 was stable. Was dysynchronous with the vent on 1/2 so Propofol drip was started. Continues to have RUE decerebrate posturing.   Pt seen. Sedated. Intubated. Comfortable. BP 160/80, HR 80. (-) NVD. Good ae. crackles at bases. good s1/s2. (-) m. (+) BS, soft, NT. (+) edema. Limited neuro exam as pt is sedated. Posturing noted in RUE.   Labs reviewed.  Assessment and Plan: Acute R BG/Thalamic ICH/IVH with midline shift. S/P ventriculostomy drain 12/31. Prognosis is guarded. Currently with RUE decerebrate posturing. Neurology and Neurosurgery seeing pt. Cranial CT scan is stable on 1/4. Pt is now DNR. Wife having a hard time to let go. Palliative care is consulted.  They may make pt comfort care by Monday.    Acute hypoxemic resp failure 2/2 unable to protect airway and pulm edema.Cont vent  support. Not ready for wean.  Diuresced some on 1/4. Creatinine higher so will hold off diuresis for now.   I have spent 30 minutes of critical care time with this patient today.  Family :Family updated at length today.  Wife updated at bedside.    Pollie Meyer, MD 11/18/2016, 12:57 PM [facility] Pager [phone] After 3 pm or if no answer, call [phone]

## 2016-11-18 NOTE — Progress Notes (Signed)
Nutrition Follow-up  INTERVENTION:   Decrease Vital AF 1.2 to 45 ml/hr (1080 ml/day) Add 30 ml Prostat BID Provides: 1496 kcal, 111 grams protein, and 875 ml H2O.  TF regimen and propofol at current rate providing 2108 total kcal/day   NUTRITION DIAGNOSIS:   Inadequate oral intake related to inability to eat as evidenced by NPO status. Ongoing.   GOAL:   Patient will meet greater than or equal to 90% of their needs Met  MONITOR:   TF tolerance, Vent status, Labs  ASSESSMENT:   Pt admitted from home with R thalamic hemorrhage with IVH due to hypertension. Pt had L frontal ventriculostomy placed 12/31.   RN notified RD of propofol infusion. Family considering withdrawal.   Patient is currently intubated on ventilator support MV: 13.7 L/min Temp (24hrs), Avg:99.5 F (37.5 C), Min:98.2 F (36.8 C), Max:100.4 F (38 C)  Propofol: started 1/3 currently infusing @ 23.2 ml/hr provides: 612 kcal per day Vital AF 1.2 @ 65 ml/hr (1560 ml/day) Provides: 1872 kcal, 117 grams protein, and 1265 ml H2O.   Diet Order:  Diet NPO time specified  Skin:  Reviewed, no issues  Last BM:  1/4  Height:   Ht Readings from Last 1 Encounters:  10/21/2016 _0  (1.803 m)    Weight:   Wt Readings from Last 1 Encounters:  11/18/16 172 lb 2.9 oz (78.1 kg)    Ideal Body Weight:  78.1 kg  BMI:  Body mass index is 24.01 kg/m.  Estimated Nutritional Needs:   Kcal:  2030  Protein:  95-115 grams  Fluid:  >2 L/day  EDUCATION NEEDS:   No education needs identified at this time  [place], [place], [place] Pager [phone] After Hours Pager

## 2016-11-18 NOTE — Progress Notes (Signed)
STROKE TEAM PROGRESS NOTE   SUBJECTIVE (INTERVAL HISTORY) Wife and family members are at bedside. Pt still intubated on sedation. Neuro unchanged with continued extension bilaterally to pain stimulation without sedation. CT yesterday no significant change from prior. EVD in place at 10 mmHg with continued drainage.   Palliative care on board and will consider comfort care in a few days, likely on Sunday.    OBJECTIVE Temp:  [98.2 F (36.8 C)-102.2 F (39 C)] 102.2 F (39 C) (01/05 2000) Pulse Rate:  [65-94] 88 (01/05 2100) Cardiac Rhythm: Normal sinus rhythm (01/05 2000) Resp:  [13-28] 24 (01/05 2100) BP: (124-179)/(50-101) 148/66 (01/05 2100) SpO2:  [94 %-100 %] 95 % (01/05 2100) FiO2 (%):  [30 %-40 %] 30 % (01/05 2000) Weight:  [172 lb 2.9 oz (78.1 kg)] 172 lb 2.9 oz (78.1 kg) (01/05 0400)  CBC:  Recent Labs Lab 11/12/2016 1158  11/17/16 0449 11/18/16 0303  WBC 8.9  < > 12.8* 11.4*  NEUTROABS 7.2  --   --   --   HGB 14.7  < > 11.0* 11.0*  HCT 41.6  < > 33.9* 33.7*  MCV 86.8  < > 89.2 89.9  PLT 163  < > 159 153  < > = values in this interval not displayed.  Basic Metabolic Panel:   Recent Labs Lab 11/17/16 0449 11/17/16 1414 11/18/16 0303  NA 139 140 142  K 3.8 4.1 3.5  CL 111 107 108  CO2 22 24 26   GLUCOSE 140* 125* 139*  BUN 42* 42* 44*  CREATININE 1.24 1.37* 1.48*  CALCIUM 8.0* 7.8* 7.9*  MG 2.3  --  2.2  PHOS 2.8  --  3.9    Lipid Panel:     Component Value Date/Time   CHOL 166 11/14/2016 0844   TRIG 76 11/16/2016 1518   HDL 45 11/14/2016 0844   CHOLHDL 3.7 11/14/2016 0844   VLDL 15 11/14/2016 0844   LDLCALC 106 (H) 11/14/2016 0844   HgbA1c:  Lab Results  Component Value Date   HGBA1C 5.0 11/14/2016   Urine Drug Screen:     Component Value Date/Time   LABOPIA NONE DETECTED 11/14/2016 1121   COCAINSCRNUR NONE DETECTED 11/14/2016 1121   LABBENZ NONE DETECTED 11/14/2016 1121   AMPHETMU NONE DETECTED 11/14/2016 1121   THCU NONE DETECTED  11/14/2016 1121   LABBARB NONE DETECTED 11/14/2016 1121      IMAGING I have personally reviewed the radiological images below and agree with the radiology interpretations.   Ct Head Code Stroke W/o Cm 10/28/2016 1. Acute right deep gray matter hemorrhage with estimated parenchymal blood volume of 32 mL. Regional mass effect with leftward midline shift of 8 mm. 2. Extension of hemorrhage into the ventricles with moderate volume IVH and acute ventriculomegaly suspected. 3. ASPECTS is not applicable, acute intracranial hemorrhage.   11/15/2016 IMPRESSION: 1. Similar size and appearance of evolving right thalamic/basal ganglia hematoma with localized mass effect and edema. 8 mm of right-to-left midline shift is not significantly changed. 2. Intraventricular extension with moderate to severe hydrocephalus, also relatively similar. Left frontal approach ventricular catheter in place. Small amount of hemorrhage along the catheter tract is unchanged.   11/15/2016 IMPRESSION: New small volume hemorrhage along LEFT ventriculostomy catheter tract. Similar moderate to severe hydrocephalus and intraventricular hemorrhage. Evolving large RIGHT thalamic/basal ganglia hematoma. Similar 9 mm RIGHT to LEFT midline shift.   11/17/2016 IMPRESSION: 1. Stable brain since 11/15/2016.  No new intracranial abnormality. 2. Volume of Intra-axial hemorrhage at the  right deep gray matter nuclei has slightly regressed since 11/14/2016. Intra-axial blood volume now estimated at 26 mL. Stable surrounding edema and mass effect. 3. Stable ventriculomegaly and left frontal approach EVD. Intraventricular volume stable to slightly decreased since 11/15/2016.   TTE Left ventricle: The cavity size was normal. Wall thickness was   normal. Systolic function was normal. The estimated ejection   fraction was in the range of 55% to 60%. Wall motion was normal;   there were no regional wall motion abnormalities. Doppler   parameters are  consistent with abnormal left ventricular   relaxation (grade 1 diastolic dysfunction). - Aortic valve: There was no stenosis. - Mitral valve: There was no significant regurgitation. - Left atrium: The atrium was mildly dilated. - Right ventricle: The cavity size was mildly dilated. Systolic   function was normal. - Right atrium: The atrium was mildly dilated. - Pulmonary arteries: No complete TR doppler jet so unable to   estimate PA systolic pressure.  EEG This sedated EEG is abnormal due to diffuse slowing of the waking background. Clinical Correlation of the above findings indicates diffuse cerebral dysfunction that is non-specific in etiology and can be seen with hypoxic/ischemic injury, toxic/metabolic encephalopathies, neurodegenerative disorders, or medication effect.  Clinical correlation is advised.   PHYSICAL EXAM  Temp:  [98.2 F (36.8 C)-102.2 F (39 C)] 102.2 F (39 C) (01/05 2000) Pulse Rate:  [65-94] 88 (01/05 2100) Resp:  [13-28] 24 (01/05 2100) BP: (124-179)/(50-101) 148/66 (01/05 2100) SpO2:  [94 %-100 %] 95 % (01/05 2100) FiO2 (%):  [30 %-40 %] 30 % (01/05 2000) Weight:  [172 lb 2.9 oz (78.1 kg)] 172 lb 2.9 oz (78.1 kg) (01/05 0400)  General - Well nourished, well developed, intubated on sedation.  Ophthalmologic - Fundi not visualized due to noncooperation.  Cardiovascular - Regular rate and rhythm.  Neuro - intubated on sedation, eyes closed and not open on voice or pain. Not following commands. Bilateral pupil small 2mm, sluggish pupil reflex, eyes mid position, doll's eye sluggish, not blinking to visual threat bilaterally, corneal reflex weak, positive gag and cough. Facial symmetry not able to tested. On pain stimulation, BUE extention, BLE mild withdraw with distal DF. Increased muscle tone in all extremities. DTR 1+ and no babinski. Sensation, coordination and gait not tested.    ASSESSMENT/PLAN Mr. Jake Jacobs is a 76 y.o. male with history of  being "sick with severe cough" the past few days who developed acute altered mental status and L hemiplegia. CT showed R thalamic hemorrhage with IVH.   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  Neurosurgery Jake Jacobs) placed EVD, currently at 20 cmH2O  Repeat CT head 11/14/16 stable ICH with IVH and hydrocephalus  Repeat CT head 11/15/16 stable ICH with IVH and hydrocephalus and stable midline shift  Repeat CT head 11/17/16 stable, no significant change  2D Echo  EF 55-60%   EEG diffuse slowing, no seizure  LDL 106   HgbA1c 5.0  Heparin subq for VTE prophylaxis Diet NPO time specified  aspirin 81 mg daily prior to admission, now not on antithrombotics due to ICH  Ongoing aggressive stroke risk factor management  Palliative care on board  Disposition:  Family is considering comfort care in a few days, likely on Sunday when son comes  Obstructive hydrocephalus  NSG on board  S/p EVD  Repeat  CT stable hydrocephalus  Cerebral edema  Mild midline shift on CT head  Repeat CT showed stable midline shift, no significant change from prior  Hold off hypertonic saline for now  Acute Respiratory Failure  Secondary to ICH  Intubated in the ED  CCM on board  Hypertensive Emergency  BP elevated on admission, as high as 209/115  off cardene drip now  Put on lisinopril, labetalol and amlodipine for BP control  BP stable  Hyperlipidemia  LDL 106, goal <70   Hold off statin for now due to ICH and IVH  Consider statin on discharge  Other Stroke Risk Factors  Advanced age  Hospital day # 5  This patient is critically ill due to right BG ICH with IVH and hydrocephalus and at significant risk of neurological worsening, death form cerebral edema, brain herniation, hydrocephalus,  hypertensive emergency. This patient's care requires constant monitoring of vital signs, hemodynamics, respiratory and cardiac monitoring, review of multiple databases, neurological assessment, discussion with family, other specialists and medical decision making of high complexity. I spent 35 minutes of neurocritical care time in the care of this patient.  Marvel PlanJindong Trentyn Boisclair, MD PhD Stroke Neurology 11/18/2016 10:31 PM   To contact Stroke Continuity provider, please refer to [email or web address]. After hours, contact General Neurology

## 2016-11-19 DIAGNOSIS — I615 Nontraumatic intracerebral hemorrhage, intraventricular: Secondary | ICD-10-CM | POA: Diagnosis not present

## 2016-11-19 DIAGNOSIS — Z978 Presence of other specified devices: Secondary | ICD-10-CM

## 2016-11-19 DIAGNOSIS — G911 Obstructive hydrocephalus: Secondary | ICD-10-CM

## 2016-11-19 DIAGNOSIS — I619 Nontraumatic intracerebral hemorrhage, unspecified: Secondary | ICD-10-CM | POA: Diagnosis not present

## 2016-11-19 DIAGNOSIS — I610 Nontraumatic intracerebral hemorrhage in hemisphere, subcortical: Secondary | ICD-10-CM | POA: Diagnosis not present

## 2016-11-19 LAB — GLUCOSE, CAPILLARY
GLUCOSE-CAPILLARY: 95 mg/dL (ref 65–99)
GLUCOSE-CAPILLARY: 116 mg/dL — AB (ref 65–99)
Glucose-Capillary: 97 mg/dL (ref 65–99)
Glucose-Capillary: 98 mg/dL (ref 65–99)

## 2016-11-19 LAB — BASIC METABOLIC PANEL
Anion gap: 9 (ref 5–15)
BUN: 55 mg/dL — ABNORMAL HIGH (ref 6–20)
CHLORIDE: 109 mmol/L (ref 101–111)
CO2: 26 mmol/L (ref 22–32)
CREATININE: 1.49 mg/dL — AB (ref 0.61–1.24)
Calcium: 8.2 mg/dL — ABNORMAL LOW (ref 8.9–10.3)
GFR calc non Af Amer: 44 mL/min — ABNORMAL LOW (ref >60)
GFR, EST AFRICAN AMERICAN: 50 mL/min — AB (ref >60)
GLUCOSE: 117 mg/dL — AB (ref 65–99)
Potassium: 3.6 mmol/L (ref 3.5–5.1)
Sodium: 144 mmol/L (ref 135–145)

## 2016-11-19 LAB — CBC
HEMATOCRIT: 36.8 % — AB (ref 39.0–52.0)
HEMOGLOBIN: 12.1 g/dL — AB (ref 13.0–17.0)
MCH: 30.2 pg (ref 26.0–34.0)
MCHC: 32.9 g/dL (ref 30.0–36.0)
MCV: 91.8 fL (ref 78.0–100.0)
Platelets: 174 10*3/uL (ref 150–400)
RBC: 4.01 MIL/uL — ABNORMAL LOW (ref 4.22–5.81)
RDW: 14.7 % (ref 11.5–15.5)
WBC: 15.9 10*3/uL — ABNORMAL HIGH (ref 4.0–10.5)

## 2016-11-19 LAB — TRIGLYCERIDES: Triglycerides: 67 mg/dL (ref <150)

## 2016-11-19 LAB — PHOSPHORUS: PHOSPHORUS: 3.5 mg/dL (ref 2.5–4.6)

## 2016-11-19 LAB — MAGNESIUM: Magnesium: 2.4 mg/dL (ref 1.7–2.4)

## 2016-11-19 MED ORDER — ATROPINE ORAL SOLUTION 0.08 MG/ML: 0.4000 mg | ORAL | Status: DC | PRN

## 2016-11-19 MED ORDER — ATROPINE SULFATE 1 % OP SOLN
1.0000 [drp] | OPHTHALMIC | Status: DC | PRN
  Administered 2016-11-19: 1 [drp] via SUBLINGUAL
  Filled 2016-11-19: qty 2

## 2016-11-19 MED ORDER — MORPHINE BOLUS VIA INFUSION
5.0000 mg | INTRAVENOUS | Status: DC | PRN
  Filled 2016-11-19: qty 20

## 2016-11-19 MED ORDER — IPRATROPIUM-ALBUTEROL 0.5-2.5 (3) MG/3ML IN SOLN
3.0000 mL | Freq: Three times a day (TID) | RESPIRATORY_TRACT | Status: DC
  Administered 2016-11-19 (×2): 3 mL via RESPIRATORY_TRACT
  Filled 2016-11-19 (×2): qty 3

## 2016-11-19 MED ORDER — SODIUM CHLORIDE 0.9 % IV SOLN
10.0000 mg/h | INTRAVENOUS | Status: DC
  Administered 2016-11-19: 10 mg/h via INTRAVENOUS
  Filled 2016-11-19: qty 10

## 2016-11-19 MED ORDER — LORAZEPAM BOLUS VIA INFUSION
2.0000 mg | INTRAVENOUS | Status: DC | PRN
  Filled 2016-11-19: qty 5

## 2016-11-19 MED ORDER — DEXTROSE 5 % IV SOLN
5.0000 mg/h | INTRAVENOUS | Status: DC
  Administered 2016-11-19: 5 mg/h via INTRAVENOUS
  Filled 2016-11-19: qty 25

## 2016-11-20 LAB — CULTURE, BLOOD (ROUTINE X 2): CULTURE: NO GROWTH

## 2016-11-24 ENCOUNTER — Telehealth: Payer: Self-pay

## 2016-11-24 NOTE — Telephone Encounter (Signed)
On 11/24/2016 I received a death certificate from [facility] (original[facility] The death certificate is for burial. The patient is a patient of Doctor Tyson AliasFeinstein. [id number] this pm for signature.  On 11/28/2016 I received the death certificate back from Doctor Ramaswamy. I got the death certificate ready and called the funeral home to let them know I [facility] the [facility] the [facility] request.

## 2016-12-15 NOTE — Progress Notes (Signed)
Pt seen and examined. No issues overnight.  EXAM: Temp:  [98.8 F (37.1 C)-102.2 F (39 C)] 101.5 F (38.6 C) (01/06 0801) Pulse Rate:  [70-94] 87 (01/06 0801) Resp:  [15-30] 30 (01/06 0801) BP: (127-179)/(53-73) 137/57 (01/06 0700) SpO2:  [94 %-100 %] 99 % (01/06 0801) FiO2 (%):  [30 %-40 %] 30 % (01/06 0801) Weight:  [79.7 kg (175 lb 11.3 oz)] 79.7 kg (175 lb 11.3 oz) (01/06 0500) Intake/Output      01/05 0701 - 01/06 0700 01/06 0701 - 01/07 0700   I.V. (mL/kg) 445.9 (5.6)    NG/GT 1105    Total Intake(mL/kg) 1550.9 (19.5)    Urine (mL/kg/hr) 1000 (0.5)    Drains 233 (0.1)    Total Output 1233     Net +317.9           No eye opening PERRL Extensor posturing RUE, no movement LUE/LLE  Exam unchanged Drain functioning

## 2016-12-15 NOTE — Progress Notes (Signed)
STROKE TEAM PROGRESS NOTE   SUBJECTIVE (INTERVAL HISTORY) Wife and family members are at bedside. Pt still intubated on sedation. Neuro unchanged with continued extension bilaterally to pain stimulation without sedation. CT yesterday no significant change from prior. EVD in place at 10 mmHg with continued drainage.   Palliative care meeting on Monday morning to discuss w/d of care. Family is at bedside including wife and daughters.   OBJECTIVE Temp:  [98.8 F (37.1 C)-102.2 F (39 C)] 101.5 F (38.6 C) (01/06 0801) Pulse Rate:  [70-94] 85 (01/06 0907) Cardiac Rhythm: Normal sinus rhythm (01/06 0800) Resp:  [15-30] 30 (01/06 0801) BP: (127-179)/(53-73) 139/62 (01/06 0908) SpO2:  [94 %-100 %] 99 % (01/06 0801) FiO2 (%):  [30 %-40 %] 30 % (01/06 0801) Weight:  [79.7 kg (175 lb 11.3 oz)] 79.7 kg (175 lb 11.3 oz) (01/06 0500)  CBC:  Recent Labs Lab December 09, 2016 1158  11/18/16 0303 12/11/2016 0426  WBC 8.9  < > 11.4* 15.9*  NEUTROABS 7.2  --   --   --   HGB 14.7  < > 11.0* 12.1*  HCT 41.6  < > 33.7* 36.8*  MCV 86.8  < > 89.9 91.8  PLT 163  < > 153 174  < > = values in this interval not displayed.  Basic Metabolic Panel:   Recent Labs Lab 11/18/16 0303 12/14/2016 0426  NA 142 144  K 3.5 3.6  CL 108 109  CO2 26 26  GLUCOSE 139* 117*  BUN 44* 55*  CREATININE 1.48* 1.49*  CALCIUM 7.9* 8.2*  MG 2.2 2.4  PHOS 3.9 3.5    Lipid Panel:     Component Value Date/Time   CHOL 166 11/14/2016 0844   TRIG 76 11/16/2016 1518   HDL 45 11/14/2016 0844   CHOLHDL 3.7 11/14/2016 0844   VLDL 15 11/14/2016 0844   LDLCALC 106 (H) 11/14/2016 0844   HgbA1c:  Lab Results  Component Value Date   HGBA1C 5.0 11/14/2016   Urine Drug Screen:     Component Value Date/Time   LABOPIA NONE DETECTED 11/14/2016 1121   COCAINSCRNUR NONE DETECTED 11/14/2016 1121   LABBENZ NONE DETECTED 11/14/2016 1121   AMPHETMU NONE DETECTED 11/14/2016 1121   THCU NONE DETECTED 11/14/2016 1121   LABBARB NONE  DETECTED 11/14/2016 1121      IMAGING I have personally reviewed the radiological images below and agree with the radiology interpretations.   Ct Head Code Stroke W/o Cm 12/09/16 1. Acute right deep gray matter hemorrhage with estimated parenchymal blood volume of 32 mL. Regional mass effect with leftward midline shift of 8 mm.  2. Extension of hemorrhage into the ventricles with moderate volume IVH and acute ventriculomegaly suspected.  3. ASPECTS is not applicable, acute intracranial hemorrhage.    11/15/2016 IMPRESSION:  1. Similar size and appearance of evolving right thalamic/basal ganglia hematoma with localized mass effect and edema. 8 mm of right-to-left midline shift is not significantly changed.  2. Intraventricular extension with moderate to severe hydrocephalus, also relatively similar. Left frontal approach ventricular catheter in place. Small amount of hemorrhage along the catheter tract is unchanged.    11/15/2016 IMPRESSION:  New small volume hemorrhage along LEFT ventriculostomy catheter tract. Similar moderate to severe hydrocephalus and intraventricular hemorrhage. Evolving large RIGHT thalamic/basal ganglia hematoma. Similar 9 mm RIGHT to LEFT midline shift.    11/17/2016 IMPRESSION:  1. Stable brain since 11/15/2016.  No new intracranial abnormality.  2. Volume of Intra-axial hemorrhage at the right deep gray matter  nuclei has slightly regressed since 11/14/2016. Intra-axial blood volume now estimated at 26 mL. Stable surrounding edema and mass effect.  3. Stable ventriculomegaly and left frontal approach EVD. Intraventricular volume stable to slightly decreased since 11/15/2016.    TTE Left ventricle: The cavity size was normal. Wall thickness was   normal. Systolic function was normal. The estimated ejection   fraction was in the range of 55% to 60%. Wall motion was normal;   there were no regional wall motion abnormalities. Doppler   parameters are consistent  with abnormal left ventricular   relaxation (grade 1 diastolic dysfunction). - Aortic valve: There was no stenosis. - Mitral valve: There was no significant regurgitation. - Left atrium: The atrium was mildly dilated. - Right ventricle: The cavity size was mildly dilated. Systolic   function was normal. - Right atrium: The atrium was mildly dilated. - Pulmonary arteries: No complete TR doppler jet so unable to   estimate PA systolic pressure.  EEG This sedated EEG is abnormal due to diffuse slowing of the waking background. Clinical Correlation of the above findings indicates diffuse cerebral dysfunction that is non-specific in etiology and can be seen with hypoxic/ischemic injury, toxic/metabolic encephalopathies, neurodegenerative disorders, or medication effect.  Clinical correlation is advised.   PHYSICAL EXAM  Temp:  [98.8 F (37.1 C)-102.2 F (39 C)] 101.5 F (38.6 C) (01/06 0801) Pulse Rate:  [70-94] 85 (01/06 0907) Resp:  [15-30] 30 (01/06 0801) BP: (127-179)/(53-73) 139/62 (01/06 0908) SpO2:  [94 %-100 %] 99 % (01/06 0801) FiO2 (%):  [30 %-40 %] 30 % (01/06 0801) Weight:  [79.7 kg (175 lb 11.3 oz)] 79.7 kg (175 lb 11.3 oz) (01/06 0500)   Exam unchanged today:  General - Well nourished, well developed, intubated on sedation.  Ophthalmologic - Fundi not visualized due to noncooperation.  Cardiovascular - Regular rate and rhythm.  Neuro - intubated on sedation, eyes closed and not open on voice or pain. Not following commands. Bilateral pupil small 2mm, sluggish pupil reflex, eyes mid position, doll's eye sluggish, not blinking to visual threat bilaterally, corneal reflex weak, positive gag and cough. Facial symmetry not able to tested. On pain stimulation, BUE extention, BLE mild withdraw with distal DF. Increased muscle tone in all extremities. DTR 1+ and no babinski. Sensation, coordination and gait not tested.    ASSESSMENT/PLAN Mr. Paras Kreider is a 76 y.o. male  with history of being "sick with severe cough" the past few days who developed acute altered mental status and L hemiplegia. CT showed R thalamic hemorrhage with IVH.   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 hemorrhage secondary to hypertensive source  Resultant  Intubation and left hemiplegia  CT - R thalamic ICH with IVH and hydrocephalus  Neurosurgery Conchita Paris) placed EVD, currently at 20 cmH2O  Repeat CT head 11/14/16 stable ICH with IVH and hydrocephalus  Repeat CT head 11/15/16 stable ICH with IVH and hydrocephalus and stable midline shift  Repeat CT head 11/17/16 stable, no significant change  2D Echo  EF 55-60%   EEG diffuse slowing, no seizure  LDL 106   HgbA1c 5.0  Heparin subq for VTE prophylaxis Diet NPO time specified  aspirin 81 mg daily prior to admission, now not on antithrombotics due to ICH  Ongoing aggressive stroke risk factor management  Palliative care on board  Disposition:  Family is considering comfort care in a few days, meeting Monday to discuss withdrawal of care  Obstructive hydrocephalus  NSG on board  S/p EVD  Repeat CT stable hydrocephalus  Cerebral edema  Mild midline shift on CT head  Repeat CT showed stable midline shift, no significant change from prior  Hold off hypertonic saline for now  Acute Respiratory Failure  Secondary to ICH  Intubated in the ED  CCM on board  Hypertensive Emergency  BP elevated on admission, as high as 209/115  off cardene drip now  Put on lisinopril, labetalol and amlodipine for BP control  BP stable  Hyperlipidemia  LDL 106, goal <70   Hold off statin for now due to ICH and IVH  Consider statin on discharge  Other Stroke Risk Factors  Advanced age  Hospital day # 6  This patient is critically ill due to right BG ICH with IVH and hydrocephalus and at significant risk of neurological worsening, death form cerebral edema, brain  herniation, hydrocephalus, hypertensive emergency. This patient's care requires constant monitoring of vital signs, hemodynamics, respiratory and cardiac monitoring, review of multiple databases, neurological assessment, discussion with family, other specialists and medical decision making of high complexity. I spent 35 minutes of neurocritical care time in the care of this patient.   Personally examined patient and images, and have participated in and made any corrections needed to history, physical, neuro exam,assessment and plan as stated above.  I have personally obtained the history, evaluated lab date, reviewed imaging studies and agree with radiology interpretations.    Naomie DeanAntonia Raizy Auzenne, MD Stroke Neurology  To contact Stroke Continuity provider, please refer to [email or web address]. After hours, contact General Neurology

## 2016-12-15 NOTE — Discharge Summary (Signed)
Admitted large ICH , into ventricles, hydro present, had EVD placed. Made no progress and postuing. D/w family , decided comfort care  Final diagnosis upon death  1. Large right  ICH 2. 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 Acute resp failure  Mcarthur Rossettianiel J. Tyson AliasFeinstein, MD, FACP Pgr: [phone] [facility]

## 2016-12-15 NOTE — Plan of Care (Signed)
Problem: Nutrition: Goal: Dietary intake will improve Outcome: Completed/Met Date Met: 12/01/2016 Patient receiving Vital AF 1.2 per OGT   

## 2016-12-15 NOTE — Procedures (Signed)
Extubation Procedure Note  Patient Details:   Name: Jake Jacobs DOB: 10/02/1941 MRN: [id number]   Airway Documentation:     Evaluation  O2 sats: transiently fell during during procedure and currently acceptable Complications: Complications of desating Patient did not tolerate procedure well. Bilateral Breath Sounds: Clear, Diminished   No   PT was a terminal extubation  PT was terminated to room air  Fong Mccarry, Duane LopeJeffrey D 12/07/2016, 5:36 PM

## 2016-12-15 NOTE — Progress Notes (Addendum)
PULMONARY / CRITICAL CARE MEDICINE   Name:Jake Jacobs MRN: [id number] DOB: 04/04/1941    ADMISSION DATE:  11/01/2016 CONSULTATION DATE:  12/312  REFERRING MD:  Denton Lanksteinl   CHIEF COMPLAINT:  ICH  SUBJECTIVE:  No acute change overnight. Remains on propofol gtt.   VITAL SIGNS: BP 139/62   Pulse 85   Temp (!) 101.5 F (38.6 C) (Axillary)   Resp (!) 30   Ht 5\' 11"  (1.803 m)   Wt 175 lb 11.3 oz (79.7 kg)   SpO2 99%   BMI 24.51 kg/m   HEMODYNAMICS:    VENTILATOR SETTINGS: Vent Mode: PRVC FiO2 (%):  [30 %-40 %] 30 % Set Rate:  [16 bmp] 16 bmp Vt Set:  [600 mL] 600 mL PEEP:  [5 cmH20] 5 cmH20 Plateau Pressure:  [16 cmH20-19 cmH20] 19 cmH20  INTAKE / OUTPUT: I/O last 3 completed shifts: In: 2519.3 [I.V.:634.3; NG/GT:1885] Out: 2352 [Urine:2025; Drains:327]  PHYSICAL EXAMINATION: General:  Adult male, no distress, on vent  Neuro:  Sedated, posturing BUE/BLE HEENT:  NCAT. Orally intubated. ICV in place 13 cc drainage hourly Cardiovascular:  RRR, no MRG, NI S1/S2 Lungs:  Clear breath sounds bilaterally, on vent  Abdomen:  Soft, not tender + bowel sounds  Musculoskeletal:  Equal bulk  Skin:  Warm and dry, intact   LABS:  BMET  Recent Labs Lab 11/17/16 1414 11/18/16 0303 2017-04-08 0426  NA 140 142 144  K 4.1 3.5 3.6  CL 107 108 109  CO2 24 26 26   BUN 42* 44* 55*  CREATININE 1.37* 1.48* 1.49*  GLUCOSE 125* 139* 117*    Electrolytes  Recent Labs Lab 11/17/16 0449 11/17/16 1414 11/18/16 0303 2017-04-08 0426  CALCIUM 8.0* 7.8* 7.9* 8.2*  MG 2.3  --  2.2 2.4  PHOS 2.8  --  3.9 3.5    CBC  Recent Labs Lab 11/17/16 0449 11/18/16 0303 2017-04-08 0426  WBC 12.8* 11.4* 15.9*  HGB 11.0* 11.0* 12.1*  HCT 33.9* 33.7* 36.8*  PLT 159 153 174    Coag's  Recent Labs Lab 10/16/2016 1158  APTT 34  INR 1.07    Sepsis Markers No results for input(s): LATICACIDVEN, PROCALCITON, O2SATVEN in the last 168 hours.  ABG  Recent Labs Lab 11/12/2016 1355  11/14/16 0450  PHART 7.370 7.385  PCO2ART 44.3 43.9  PO2ART 459.0* 118*    Liver Enzymes  Recent Labs Lab 10/19/2016 1158 11/16/16 0443  AST 20 23  ALT 11* 15*  ALKPHOS 59 39  BILITOT 1.0 0.5  ALBUMIN 3.5 2.5*    Cardiac Enzymes No results for input(s): TROPONINI, PROBNP in the last 168 hours.  Glucose  Recent Labs Lab 11/18/16 1131 11/18/16 1547 11/18/16 2022 2017-04-08 0043 2017-04-08 0325 2017-04-08 0725  GLUCAP 111* 146* 134* 98 97 95    Imaging No results found.   STUDIES:  CT head 12/31: 1. Acute right deep gray matter hemorrhage w Regional mass effect with leftward midline shift of 8 mm.2. Extension of hemorrhage into the ventricles with moderate volume  IVH and acute ventriculomegaly suspected. CT head 1/1: 1. Status post EVD placement with no adverse features. Ventricular size has not significantly changed, and might therefore be at baseline for this patient. The volume of intraventricular hemorrhage has minimally increased since yesterday. 2. Similar slight increase in the volume of intra-axial hemorrhage centered at the right deep gray matter nuclei. Estimated blood volume now 39 mL (versus 32 mL previously). 3. Regional mass effect and leftward midline shift of 8-9 mm  have not significantly changed. 4. No new intracranial abnormality. CT head 1/2: IMPRESSION:New small volume hemorrhage along LEFT ventriculostomy catheter tract. Similar moderate to severe hydrocephalus and intraventricular hemorrhage.Evolving large RIGHT thalamic/basal ganglia hematoma. Similar 9 mm RIGHT to LEFT midline shift. Echo 1/2 > EF 55-60%, G1DD  CULTURES: Blood 1/2>>>  1/2 coag neg staph (suspect contaminant)  CSF 1/2>>>cancelled   ANTIBIOTICS:  LINES/TUBES: OETT 12/31>>> IVC 12/31>>>  DISCUSSION: Large R thalamic/BG ICH with IVH, with 8 mm R to L midline shift, likely HTNsive bleed. S/P Ventricular drain. Intubated for airway protection. CT scan of head 1/2 with new  hemorrhage per ventriculostomy site and rpt cranial ct scan on 1/2 was stable. Clinical decline 1/3.   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 Volume overload  P:   Full vent support -Mental Status barrier to extubation  Daily SBT  Per wife pt would NOT want trach/PEG/prolonged care but not ready to make decisions for withdrawal Intermittent CXR PAD protocol    CARDIOVASCULAR A:  HTN  P:  Tele SBP goal < 160  RENAL Lab Results  Component Value Date   CREATININE 1.49 (H) 12/01/2016   CREATININE 1.48 (H) 11/18/2016   CREATININE 1.37 (H) 11/17/2016    Recent Labs Lab 11/17/16 1414 11/18/16 0303 12/01/16 0426  NA 140 142 144     A:   Mild AKI P:   F/u chem in am  Gentle IVFs  Strict I&O Lasix as above   GASTROINTESTINAL A:   Nausea/vomiting (d/t increased ICP?) P:   NPO Cont TF   HEMATOLOGIC A:   No acute  P:  scds in setting of ICH Trend CBC Transfuse per ICU protocol   INFECTIOUS A:   Fever, mild increase in leukocytosis  P:   Trend CBC & fever curve  ENDOCRINE CBG (last 3)   Recent Labs  Dec 01, 2016 0043 2016/12/01 0325 12/01/2016 0725  GLUCAP 98 97 95     A:   Mild hyperglycemia  P:   ssi     FAMILY  - Updates: Family updated at bedside 1/5.  Per previous discussions - wife and son agree that patient would not want tracheostomy or to live in nursing home. Wife agrees that if patient heart stops that no drugs or manuel compressions be preformed.   - Inter-disciplinary family meet or Palliative Care meeting due by:  1/8 (Plan to have meeting Monday morning to discuss withdrawal of care)     CCT=30min  Brett Canales Minor ACNP [facility] PCCM Pager [phone] till 3  pm If no answer page [phone] 2016-12-01, 9:44 AM   STAFF NOTE: I, Rory Percy, MD FACP have personally reviewed patient's available data, including medical history, events of note, physical examination and test results as part of my evaluation. I have discussed with resident/NP and other care providers such as pharmacist, RN and RRT. In addition, I personally evaluated patient and elicited key findings of: posturing uppers, lungs clear, CT reviewed and this does NOT appear to be compatable with survival, pcxr with some int edema noted, no free water, feeding to remain, I am speaking to wife about immediate comfort care, this pt is NOT benefiting from any forms of life support and requires full  comfort care, drain per NS, no heroics would be appropriate and waiting for days to decide on comfort would not be fair The patient is critically ill with multiple organ systems failure and requires high complexity decision making for assessment and support, frequent evaluation and titration of therapies, application of advanced monitoring technologies and extensive interpretation of multiple databases.   Critical Care Time devoted to patient care services described in this note is30 Minutes. This time reflects time of care of this signee: Rory Percy, MD FACP. This critical care time does not reflect procedure time, or teaching time or supervisory time of PA/NP/Med student/Med Resident etc but could involve care discussion time. Rest per NP/medical resident whose note is outlined above and that I agree with   Mcarthur Rossetti. Tyson Alias, MD, FACP Pgr: [phone] [facility] Dec 01, 2016 12:47 PM    Extensive discussion with family wife. We discussed the poor prognosis and likely poor quality of life. Family has decided to offer full comfort care. They are aware that the patient may be transferred to palliative care floor for continued comfort care needs. They have been fully updated on  the process and expectations. Will update son on way from [place] then proceed  Mcarthur Rossetti. Tyson Alias, MD, FACP Pgr: [phone] [facility]

## 2016-12-15 DEATH — deceased

## 2017-05-18 IMAGING — CT CT HEAD W/O CM
3 of 4 series · 14 of 47 positions shown, 16 images · non-contrast
Comparison: 11/15/2016 head CTs and earlier.

CLINICAL DATA: 75-year-old male with acute intracranial hemorrhage
presenting on 11/13/2016. Status post EVD. Initial encounter.

EXAM:
CT HEAD WITHOUT CONTRAST
TECHNIQUE: Contiguous axial images were obtained from the base of the skull
through the vertex without intravenous contrast.

[Series 3: head 2.0 h70h · axial · 0.42mm/px · z∈[-137,-11]mm · 8 of 79 slices shown, 10 images]
[im 8/79  brain]
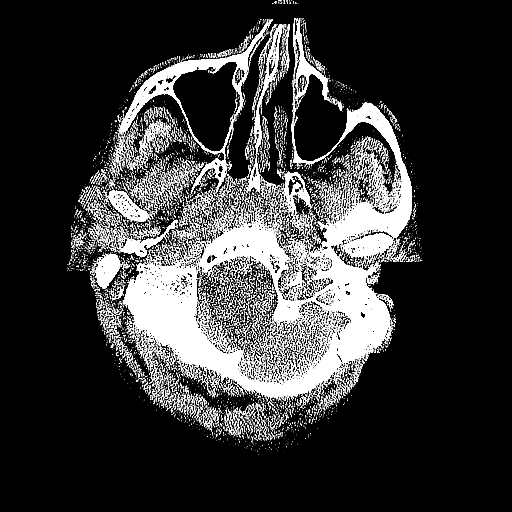
[im 8/79  bone]
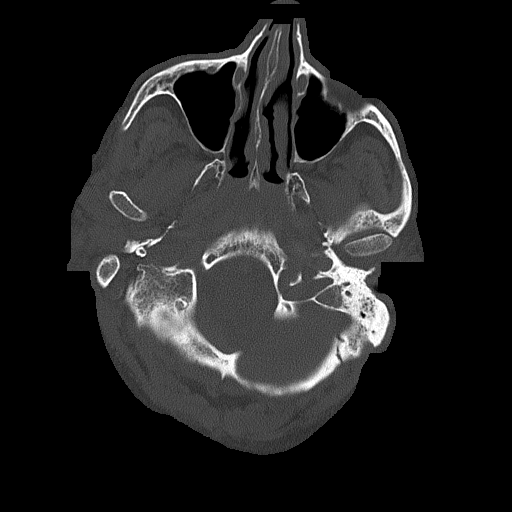
[im 16/79  brain]
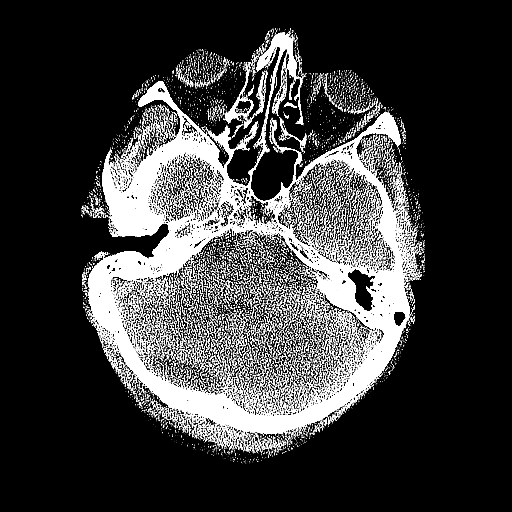
[im 24/79  brain]
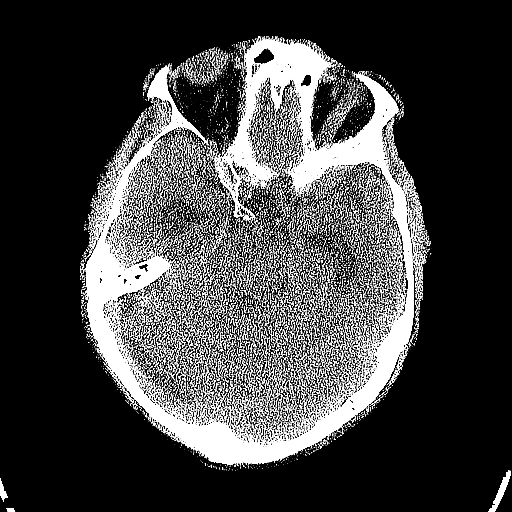
[im 36/79  brain]
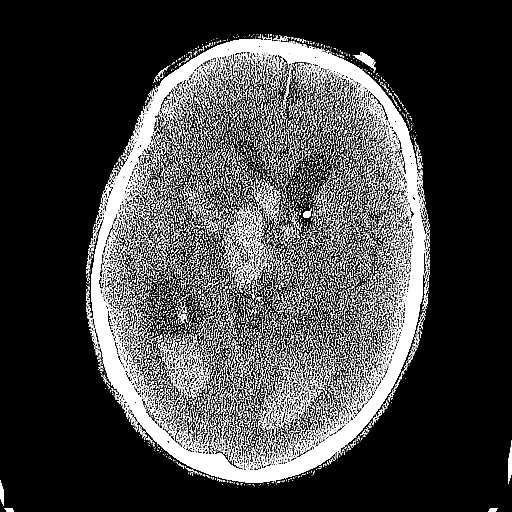
[im 43/79  brain]
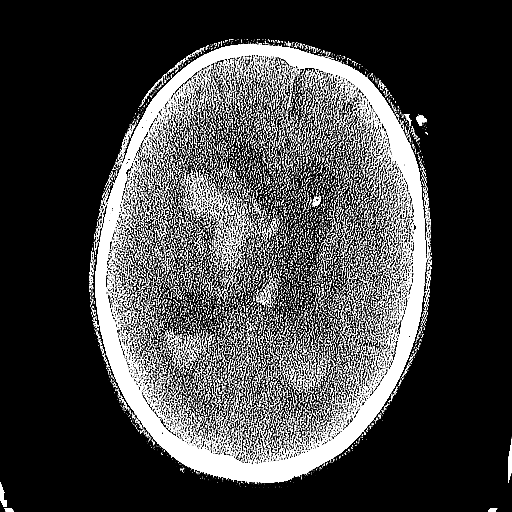
[im 43/79  bone]
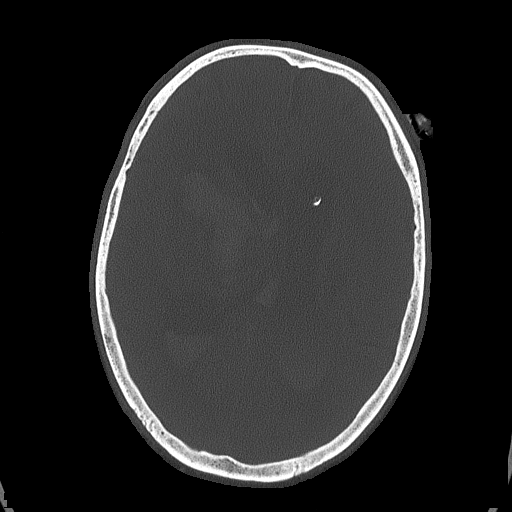
[im 55/79  brain]
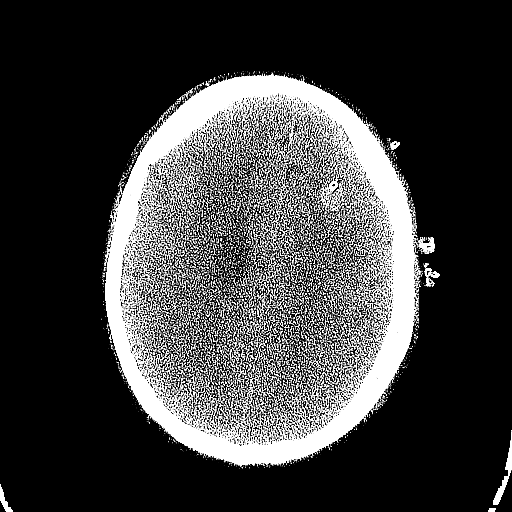
[im 63/79  brain]
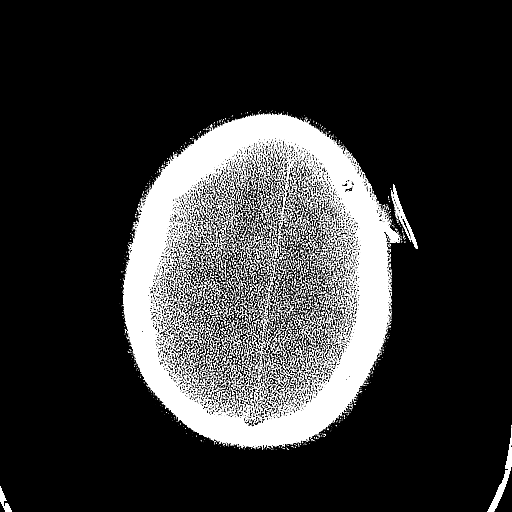
[im 71/79  brain]
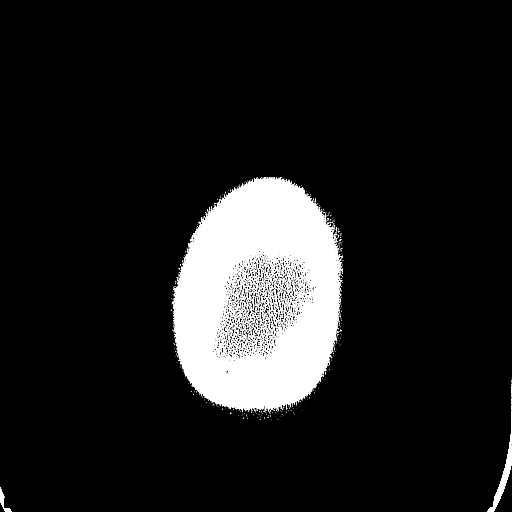

[Series 4: head 3.0 mpr cor · coronal · 0.31mm/px · 3 of 72 slices shown]
[im 24/72  brain]
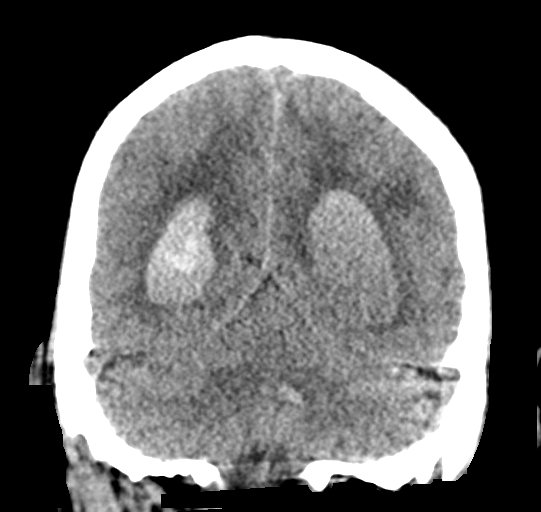
[im 32/72  brain]
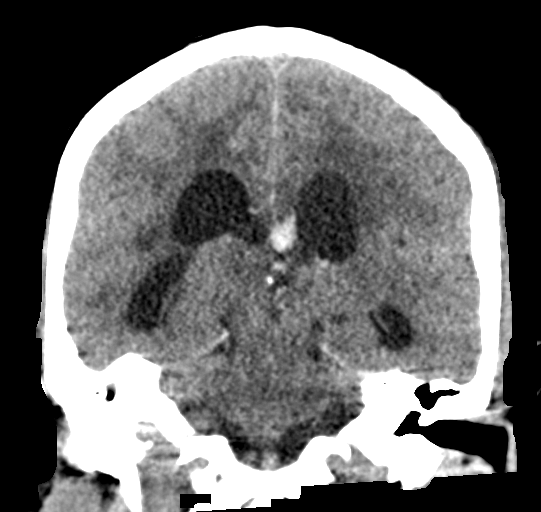
[im 40/72  brain]
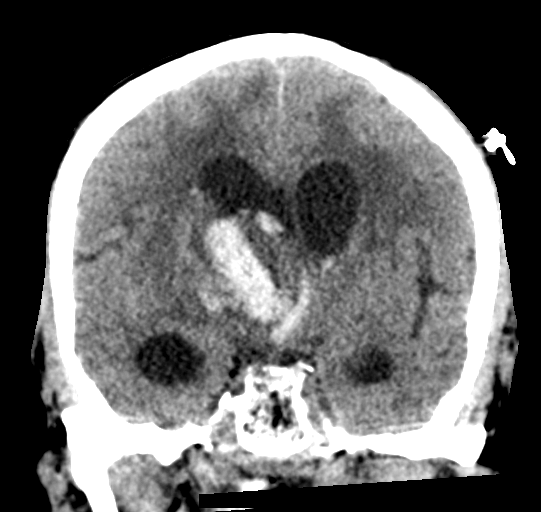

[Series 5: head 3.0 mpr sag · sagittal · 0.31mm/px · 3 of 57 slices shown]
[im 19/57  brain]
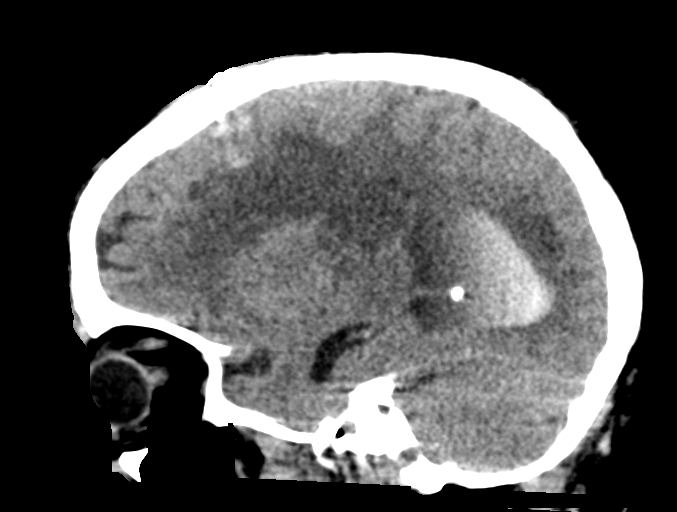
[im 29/57  brain]
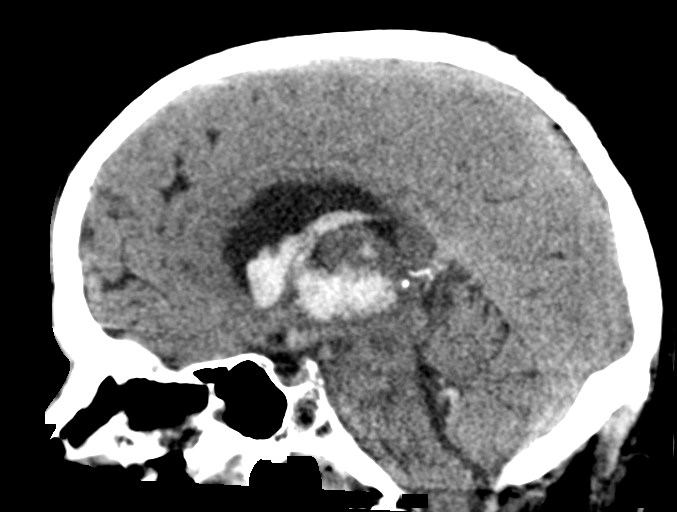
[im 38/57  brain]
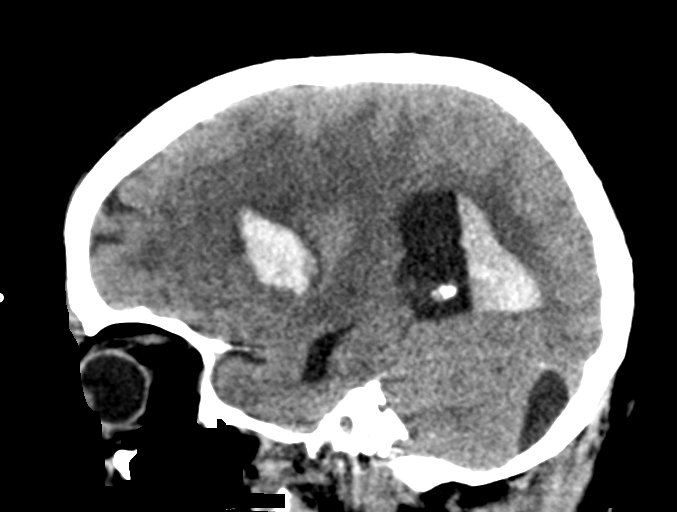

[14 of 47 positions shown; findings below may reference images not displayed]

FINDINGS: Brain: Left superior frontal approach EVD appears stable,
terminating along the inferior aspect of the left frontal horn. The
moderate volume of intraventricular hemorrhage has slightly
regressed since 11/15/2016, most notably in the fourth ventricle.

Ventricle size is stable since 11/14/2016.

Intra-axial hemorrhage centered at the right deep gray matter nuclei
encompasses 43 by 38 x 32 mm foreign estimated blood volume of 26 mL
which is mildly regressed since 11/14/2016. Regional mass effect and
edema are stable. Leftward midline shift of 9 mm has not
significantly changed.

Gray and white-matter density elsewhere in the brain is stable.
Small right posterior fossa arachnoid cyst again suspected.

Vascular: Calcified atherosclerosis at the skull base.

Skull: Stable left superior frontal burr hole for EVD. No acute
osseous abnormality identified.

Sinuses/Orbits: stable mostly ethmoid and sphenoid sinus
opacification. Some bubbly opacity. Chronic mastoid sclerosis.
Superimposed mild mastoid effusions now all, acute on chronic on the
right.

Other: Sequelae of left superior approach EVD. No acute orbit or
scalp soft tissue findings.
IMPRESSION: 1. Stable brain since 11/15/2016.  No new intracranial abnormality.
2. Volume of Intra-axial hemorrhage at the right deep gray matter
nuclei has slightly regressed since 11/14/2016. Intra-axial blood
volume now estimated at 26 mL. Stable surrounding edema and mass
effect.
3. Stable ventriculomegaly and left frontal approach EVD.
Intraventricular volume stable to slightly decreased since
11/15/2016.

## 2017-05-19 IMAGING — DX DG CHEST 1V PORT
1 series · 1 of 1 positions shown · non-contrast
Comparison: Chest radiograph from one day prior.

CLINICAL DATA: Respiratory failure.  Intubated.  CVA.

EXAM:
PORTABLE CHEST 1 VIEW

[chest ap]
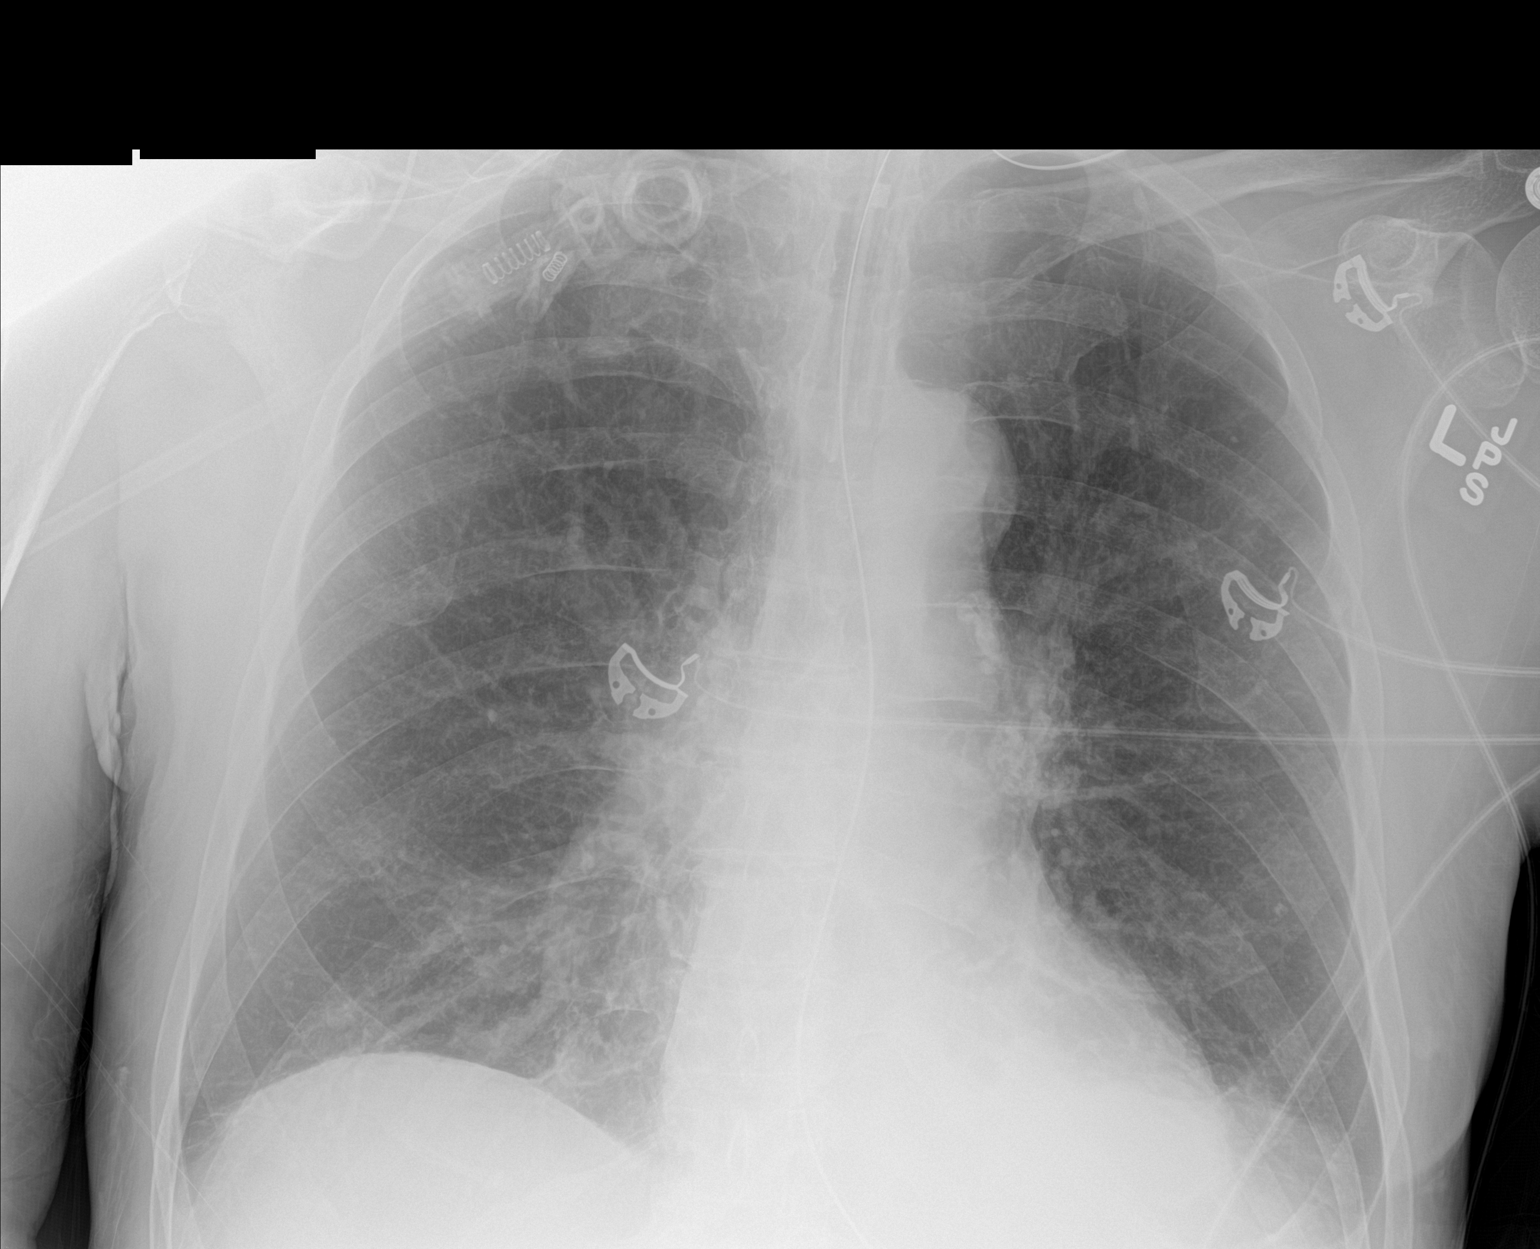

[1 of 1 positions shown; findings below may reference images not displayed]

FINDINGS: Endotracheal tube tip is 4.2 cm above the carina. Enteric tube
enters stomach with the tip not seen on this image. Stable
cardiomediastinal silhouette with normal heart size. No
pneumothorax. No pleural effusion. No pulmonary edema. Mild
bibasilar atelectasis appears stable. No new consolidative airspace
disease. Stable granulomatous calcifications overlying the left
hilum.
IMPRESSION: 1. Well-positioned support structures.
2. Stable mild bibasilar atelectasis.
